# Patient Record
Sex: Female | Born: 1995 | Race: White | Hispanic: Yes | Marital: Married | Smoking: Never smoker
Health system: Southern US, Community
[De-identification: ages and names within clinical notes are randomized; demographics above are authoritative.]

## PROBLEM LIST (undated history)

## (undated) DIAGNOSIS — K2970 Gastritis, unspecified, without bleeding: Secondary | ICD-10-CM

## (undated) DIAGNOSIS — G8929 Other chronic pain: Secondary | ICD-10-CM

## (undated) DIAGNOSIS — K219 Gastro-esophageal reflux disease without esophagitis: Secondary | ICD-10-CM

## (undated) DIAGNOSIS — K900 Celiac disease: Secondary | ICD-10-CM

## (undated) DIAGNOSIS — R1013 Epigastric pain: Secondary | ICD-10-CM

## (undated) HISTORY — DX: Epigastric pain: R10.13

## (undated) HISTORY — DX: Gastro-esophageal reflux disease without esophagitis: K21.9

## (undated) HISTORY — DX: Other chronic pain: G89.29

## (undated) HISTORY — DX: Celiac disease: K90.0

---
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
## 2016-02-03 IMAGING — CR DG CHEST 2V
2 series · 2 of 2 positions shown · non-contrast
Comparison: 09/16/2007

CLINICAL DATA: Shortness of breath, chest pain

EXAM:
CHEST  2 VIEW

[view not recorded (1 of 2)]
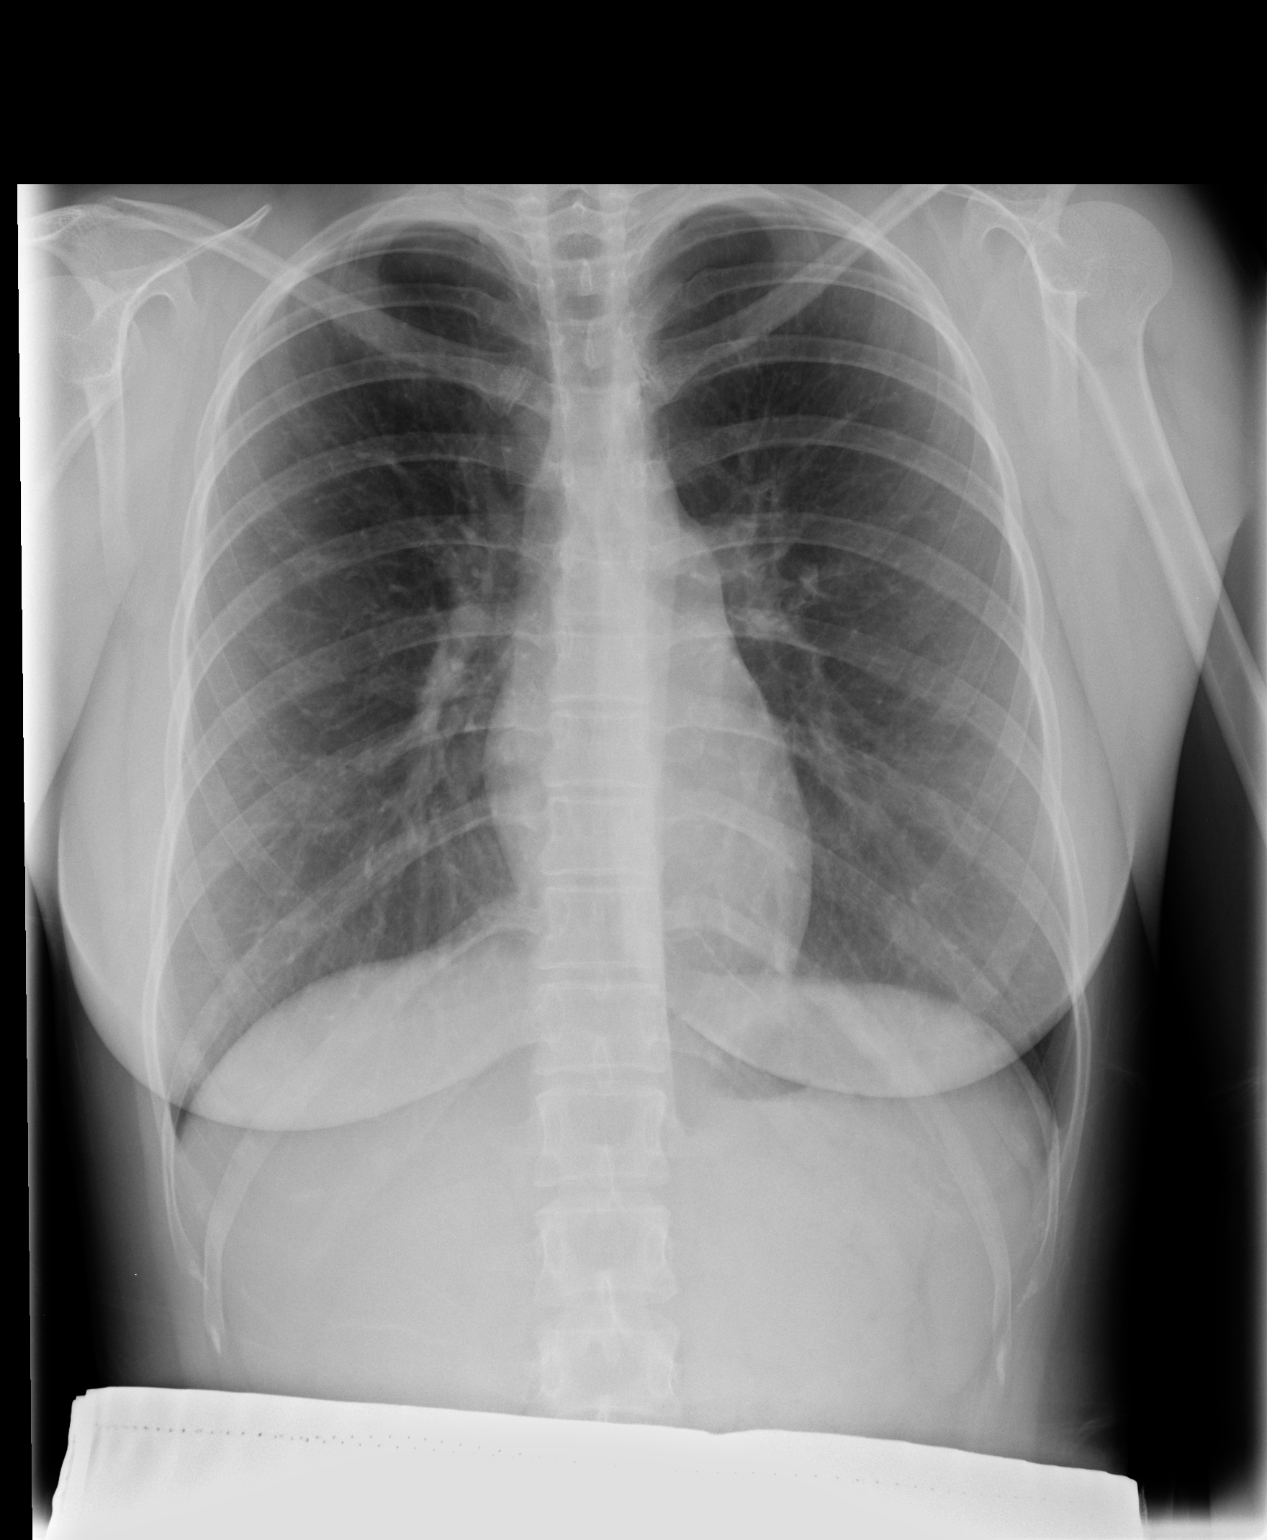

[view not recorded (2 of 2)]
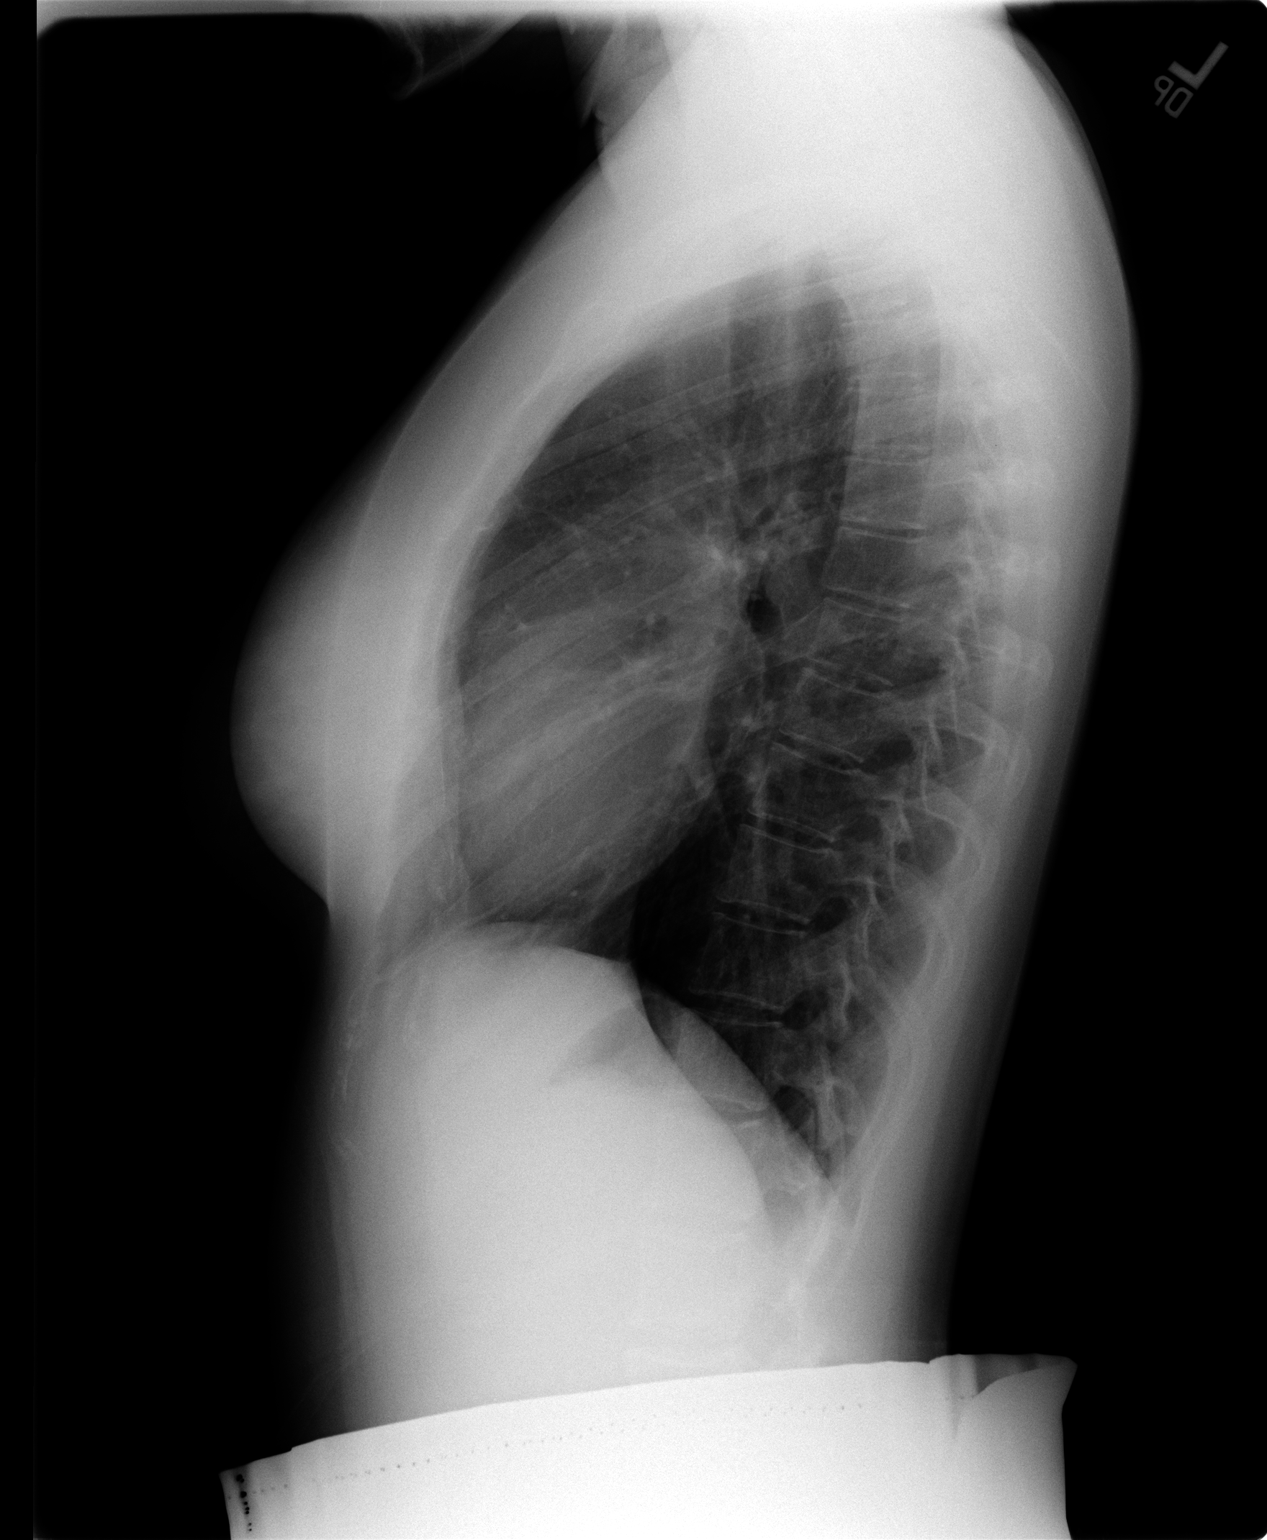

[2 of 2 positions shown; findings below may reference images not displayed]

FINDINGS: The heart size and mediastinal contours are within normal limits.
Both lungs are clear. The visualized skeletal structures are
unremarkable.
IMPRESSION: No active cardiopulmonary disease.

## 2016-02-14 ENCOUNTER — Ambulatory Visit
Admission: RE | Admit: 2016-02-14 | Discharge: 2016-02-14 | Disposition: A | Discharge status: Home / Self Care | Payer: Medicaid Other | Source: Ambulatory Visit | Attending: Pediatrics | Admitting: Pediatrics

## 2016-02-14 ENCOUNTER — Other Ambulatory Visit: Payer: Self-pay | Admitting: Pediatrics

## 2016-02-14 DIAGNOSIS — R079 Chest pain, unspecified: Secondary | ICD-10-CM

## 2016-02-14 DIAGNOSIS — R55 Syncope and collapse: Secondary | ICD-10-CM

## 2016-02-14 DIAGNOSIS — R42 Dizziness and giddiness: Secondary | ICD-10-CM

## 2016-02-14 DIAGNOSIS — R200 Anesthesia of skin: Secondary | ICD-10-CM

## 2016-02-27 ENCOUNTER — Ambulatory Visit
Admission: RE | Admit: 2016-02-27 | Discharge: 2016-02-27 | Disposition: A | Discharge status: Home / Self Care | Payer: Medicaid Other | Source: Ambulatory Visit | Attending: Pediatrics | Admitting: Pediatrics

## 2016-02-27 DIAGNOSIS — R200 Anesthesia of skin: Secondary | ICD-10-CM

## 2016-02-27 DIAGNOSIS — R55 Syncope and collapse: Secondary | ICD-10-CM

## 2016-02-27 DIAGNOSIS — R42 Dizziness and giddiness: Secondary | ICD-10-CM

## 2016-04-18 ENCOUNTER — Encounter (HOSPITAL_COMMUNITY): Payer: Self-pay

## 2016-04-18 ENCOUNTER — Emergency Department (HOSPITAL_COMMUNITY)
Admission: EM | Admit: 2016-04-18 | Discharge: 2016-04-18 | Disposition: A | Discharge status: Home / Self Care | Payer: Medicaid Other | Attending: Emergency Medicine | Admitting: Emergency Medicine

## 2016-04-18 DIAGNOSIS — R064 Hyperventilation: Secondary | ICD-10-CM | POA: Diagnosis not present

## 2016-04-18 DIAGNOSIS — K219 Gastro-esophageal reflux disease without esophagitis: Secondary | ICD-10-CM | POA: Diagnosis not present

## 2016-04-18 DIAGNOSIS — F419 Anxiety disorder, unspecified: Secondary | ICD-10-CM | POA: Diagnosis present

## 2016-04-18 DIAGNOSIS — F410 Panic disorder [episodic paroxysmal anxiety] without agoraphobia: Secondary | ICD-10-CM | POA: Diagnosis not present

## 2016-04-18 LAB — COMPREHENSIVE METABOLIC PANEL
ALT: 16 U/L (ref 14–54)
AST: 20 U/L (ref 15–41)
Albumin: 4.7 g/dL (ref 3.5–5.0)
Alkaline Phosphatase: 42 U/L (ref 38–126)
Anion gap: 12 (ref 5–15)
BILIRUBIN TOTAL: 0.6 mg/dL (ref 0.3–1.2)
BUN: 13 mg/dL (ref 6–20)
CALCIUM: 10.1 mg/dL (ref 8.9–10.3)
CO2: 22 mmol/L (ref 22–32)
CREATININE: 0.51 mg/dL (ref 0.44–1.00)
Chloride: 108 mmol/L (ref 101–111)
GFR calc Af Amer: >60 mL/min (ref >60)
Glucose, Bld: 98 mg/dL (ref 65–99)
Potassium: 3.5 mmol/L (ref 3.5–5.1)
Sodium: 142 mmol/L (ref 135–145)
TOTAL PROTEIN: 8.1 g/dL (ref 6.5–8.1)

## 2016-04-18 LAB — CBC
HCT: 38.2 % (ref 36.0–46.0)
Hemoglobin: 13.9 g/dL (ref 12.0–15.0)
MCH: 31.4 pg (ref 26.0–34.0)
MCHC: 36.4 g/dL — ABNORMAL HIGH (ref 30.0–36.0)
MCV: 86.4 fL (ref 78.0–100.0)
PLATELETS: 307 10*3/uL (ref 150–400)
RBC: 4.42 MIL/uL (ref 3.87–5.11)
RDW: 12 % (ref 11.5–15.5)
WBC: 7.5 10*3/uL (ref 4.0–10.5)

## 2016-04-18 LAB — I-STAT BETA HCG BLOOD, ED (MC, WL, AP ONLY): I-stat hCG, quantitative: <5 m[IU]/mL (ref <5)

## 2016-04-18 LAB — URINALYSIS, ROUTINE W REFLEX MICROSCOPIC
Bilirubin Urine: NEGATIVE
GLUCOSE, UA: NEGATIVE mg/dL
Hgb urine dipstick: NEGATIVE
KETONES UR: NEGATIVE mg/dL
NITRITE: NEGATIVE
PROTEIN: NEGATIVE mg/dL
Specific Gravity, Urine: 1.005 (ref 1.005–1.030)
pH: 7 (ref 5.0–8.0)

## 2016-04-18 LAB — URINE MICROSCOPIC-ADD ON: RBC / HPF: NONE SEEN RBC/hpf (ref 0–5)

## 2016-04-18 LAB — LIPASE, BLOOD: Lipase: 21 U/L (ref 11–51)

## 2016-04-18 MED ORDER — ONDANSETRON 4 MG PO TBDP
4.0000 mg | ORAL_TABLET | Freq: Once | ORAL | Status: AC | PRN
  Administered 2016-04-18: 4 mg via ORAL
  Filled 2016-04-18: qty 1

## 2016-04-18 MED ORDER — PROPRANOLOL HCL 20 MG PO TABS
20.0000 mg | ORAL_TABLET | Freq: Two times a day (BID) | ORAL | Status: DC | PRN
Start: 2016-04-18 — End: 2021-07-31

## 2016-04-18 NOTE — ED Notes (Addendum)
Per EMS, Pt, from school([employer]), c/o anxiety and dry heaving.  Denies pain.  EMS reports that this happens intermittently and typically lasts 2-3 hours.  Denies triggers.  Pt reports she was taking notes in class when anxiety began.  Denies SI/HI/AV.

## 2016-04-18 NOTE — Discharge Instructions (Signed)
Hyperventilation  Hyperventilation is breathing that is deeper and more rapid than normal. It is usually associated with panic and anxiety. Hyperventilation can make you feel breathless. It is sometimes called overbreathing. Breathing out too much causes a decrease in the amount of carbon dioxide gas in the blood. This leads to tingling and numbness in the hands, feet, and around the mouth. If this continues, your fingers, hands, and toes may begin to spasm. Hyperventilation usually lasts 20-30 minutes and can be associated with other symptoms of panic and anxiety, including:   · Chest pains or tightness.  · A pounding or irregular, racing heartbeat (palpitations).  · Dizziness.  · Lightheadedness.  · Dry mouth.  · Weakness.  · Confusion.  · Sleep disturbance.  CAUSES   Sudden onset (acute) hyperventilation is usually triggered by acute stress, anxiety, or emotional upset. Long-term (chronic) and recurring hyperventilation can occur with chronic lung problems, such emphysema or asthma. Other causes include:   · Nervousness.  · Stress.  · Stimulant, drug, or alcohol use.  · Lung disease.  · Infections, such as pneumonia.  · Heart problems.  · Severe pain.  · Waking from a bad dream.  · Pregnancy.  · Bleeding.  HOME CARE INSTRUCTIONS  · Learn and use breathing exercises that help you breathe from your diaphragm and abdomen.  · Practice relaxation techniques to reduce stress, such as visualization, meditation, and muscle release.  · During an attack, try breathing into a paper bag. This changes the carbon dioxide level and slows down breathing.  SEEK IMMEDIATE MEDICAL CARE IF:  · Your hyperventilation continues or gets worse.  MAKE SURE YOU:  · Understand these instructions.  · Will watch your condition.  · Will get help right away if you are not doing well or get worse.     This information is not intended to replace advice given to you by your health care provider. Make sure you discuss any questions you have with  your health care provider.     Document Released: 12/12/2000 Document Revised: 06/15/2012 Document Reviewed: 03/25/2012  Elsevier Interactive Patient Education ©2016 Elsevier Inc.    Panic Attacks  Panic attacks are sudden, short-lived surges of severe anxiety, fear, or discomfort. They may occur for no reason when you are relaxed, when you are anxious, or when you are sleeping. Panic attacks may occur for a number of reasons:   · Healthy people occasionally have panic attacks in extreme, life-threatening situations, such as war or natural disasters. Normal anxiety is a protective mechanism of the body that helps us react to danger (fight or flight response).  · Panic attacks are often seen with anxiety disorders, such as panic disorder, social anxiety disorder, generalized anxiety disorder, and phobias. Anxiety disorders cause excessive or uncontrollable anxiety. They may interfere with your relationships or other life activities.  · Panic attacks are sometimes seen with other mental illnesses, such as depression and posttraumatic stress disorder.  · Certain medical conditions, prescription medicines, and drugs of abuse can cause panic attacks.  SYMPTOMS   Panic attacks start suddenly, peak within 20 minutes, and are accompanied by four or more of the following symptoms:  · Pounding heart or fast heart rate (palpitations).  · Sweating.  · Trembling or shaking.  · Shortness of breath or feeling smothered.  · Feeling choked.  · Chest pain or discomfort.  · Nausea or strange feeling in your stomach.  · Dizziness, light-headedness, or feeling like you will faint.  · Chills   are having a heart attack. Although panic attacks can  be very scary, they are not life threatening. DIAGNOSIS  Panic attacks are diagnosed through an assessment by your health care provider. Your health care provider will ask questions about your symptoms, such as where and when they occurred. Your health care provider will also ask about your medical history and use of alcohol and drugs, including prescription medicines. Your health care provider may order blood tests or other studies to rule out a serious medical condition. Your health care provider may refer you to a mental health professional for further evaluation. TREATMENT   Most healthy people who have one or two panic attacks in an extreme, life-threatening situation will not require treatment.  The treatment for panic attacks associated with anxiety disorders or other mental illness typically involves counseling with a mental health professional, medicine, or a combination of both. Your health care provider will help determine what treatment is best for you.  Panic attacks due to physical illness usually go away with treatment of the illness. If prescription medicine is causing panic attacks, talk with your health care provider about stopping the medicine, decreasing the dose, or substituting another medicine.  Panic attacks due to alcohol or drug abuse go away with abstinence. Some adults need professional help in order to stop drinking or using drugs. HOME CARE INSTRUCTIONS   Take all medicines as directed by your health care provider.   Schedule and attend follow-up visits as directed by your health care provider. It is important to keep all your appointments. SEEK MEDICAL CARE IF:  You are not able to take your medicines as prescribed.  Your symptoms do not improve or get worse. SEEK IMMEDIATE MEDICAL CARE IF:   You experience panic attack symptoms that are different than your usual symptoms.  You have serious thoughts about hurting yourself or others.  You are taking medicine  for panic attacks and have a serious side effect. MAKE SURE YOU:  Understand these instructions.  Will watch your condition.  Will get help right away if you are not doing well or get worse.   This information is not intended to replace advice given to you by your health care provider. Make sure you discuss any questions you have with your health care provider.   Document Released: 12/15/2005 Document Revised: 12/20/2013 Document Reviewed: 07/29/2013 Elsevier Interactive Patient Education [employer]2016 Else[employer] Inc.

## 2016-04-18 NOTE — ED Provider Notes (Signed)
CSN: [id number]     Arrival date & time 04/18/16  1554 History   First MD Initiated Contact with Patient 04/18/16 1659     Chief Complaint  Patient presents with  . Dry Heaves   . Anxiety     (Consider location/radiation/quality/duration/timing/severity/associated sxs/prior Treatment) Patient is a 20 y.o. female presenting with anxiety. The history is provided by the patient.  Anxiety This is a recurrent problem. Episode onset: 1 year ago. Episode frequency: monthly, and have increased in severity. The problem has been gradually worsening (felt "heart pounding hard" and felt like she couldn't breathe, expressed to friend she was about to "have an attack"). Pertinent negatives include no abdominal pain. Nothing aggravates the symptoms. Nothing relieves the symptoms. She has tried nothing for the symptoms.    Past Medical History  Diagnosis Date  . Abdominal pain, chronic, epigastric   . Gastroesophageal reflux   . Gastritis    History reviewed. No pertinent past surgical history. Family History  Problem Relation Age of Onset  . Cholelithiasis Mother   . GER disease Father   . Cholelithiasis Father    Social History  Substance Use Topics  . Smoking status: Never Smoker   . Smokeless tobacco: Never Used  . Alcohol Use: No   OB History    No data available     Review of Systems  Gastrointestinal: Negative for abdominal pain.  All other systems reviewed and are negative.     Allergies  Review of patient's allergies indicates no known allergies.  Home Medications   Prior to Admission medications   Medication Sig Start Date End Date Taking? Authorizing Provider  omeprazole (PRILOSEC) 40 MG capsule Take 40 mg by mouth daily as needed (gas and indigestion).    Yes Historical Provider, MD  esomeprazole (NEXIUM) 40 MG capsule Take 1 capsule (40 mg total) by mouth daily before breakfast. 09/26/13 09/26/14  Jon Gills, MD  famotidine (PEPCID) 20 MG tablet Take 1 tablet (20  mg total) by mouth 2 (two) times daily. Patient not taking: Reported on 09/21/2015 10/13/14   Francee Piccolo, PA-C  LORazepam (ATIVAN) 1 MG tablet Take 1 tablet (1 mg total) by mouth every 4 (four) hours as needed (nausea, retching attacks). Patient not taking: Reported on 04/18/2016 09/21/15   Arthor Captain, PA-C  promethazine (PHENERGAN) 25 MG suppository Place 1 suppository (25 mg total) rectally every 6 (six) hours as needed for nausea or vomiting. Patient not taking: Reported on 04/18/2016 09/21/15   Arthor Captain, PA-C  sucralfate (CARAFATE) 1 GM/10ML suspension Take 10 mLs (1 g total) by mouth 4 (four) times daily -  with meals and at bedtime. Patient not taking: Reported on 04/18/2016 09/21/15   Antony Madura, PA-C   BP 109/77 mmHg  Pulse 93  Temp(Src) 98.6 F (37 C) (Oral)  Resp 18  SpO2 99%  LMP 04/03/2016 Physical Exam  Constitutional: She is oriented to person, place, and time. She appears well-developed and well-nourished. No distress.  HENT:  Head: Normocephalic.  Eyes: Conjunctivae are normal.  Neck: Neck supple. No tracheal deviation present.  Cardiovascular: Normal rate, regular rhythm and normal heart sounds.   Pulmonary/Chest: Effort normal and breath sounds normal. No respiratory distress. She has no wheezes. She has no rales.  Abdominal: Soft. She exhibits no distension. There is no tenderness.  Neurological: She is alert and oriented to person, place, and time.  Skin: Skin is warm and dry.  Psychiatric: She has a normal mood and affect.  Vitals  reviewed.   ED Course  Procedures (including critical care time) Labs Review Labs Reviewed  CBC - Abnormal; Notable for the following:    MCHC 36.4 (*)    All other components within normal limits  URINALYSIS, ROUTINE W REFLEX MICROSCOPIC (NOT AT [facility]) - Abnormal; Notable for the following:    Leukocytes, UA TRACE (*)    All other components within normal limits  URINE MICROSCOPIC-ADD ON - Abnormal; Notable for the  following:    Squamous Epithelial / LPF 0-5 (*)    Bacteria, UA RARE (*)    All other components within normal limits  LIPASE, BLOOD  COMPREHENSIVE METABOLIC PANEL  I-STAT BETA HCG BLOOD, ED ([facility], [facility], [facility] ONLY)    Imaging Review No results found. I have personally reviewed and evaluated these images and lab results as part of my medical decision-making.   EKG Interpretation None      MDM   Final diagnoses:  Panic attacks  Hyperventilation   20 y.o. female presents with Recurrent panic symptoms she has been having more frequently since graduating high school last year. 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 Today she was taking notes in class and felt anxiety, told a friend to her that she felt like she was having "an attack", she stepped outside of class and began to hyperventilate, felt like she was going to pass out and her fingers became tingly. She started vomiting and came to the emergency department. She has resolved completely since the onset of her symptoms. This appears to be related to emotional distress. She has had a previous workup including with cardiology and neurology regarding seizure-like activity that she has had previously. I discussed management of hyperventilation with her by breathing into a paper bag and she was provided low-dose propranolol to help with abortive therapy until able to follow up with her primary care provider or discuss this with a therapist. She may benefit from SSRI or other more prolonged therapy that can be followed as an outpatient.    Lyndal Pulleyaniel Jahaad Penado, MD 04/18/16 [phone]

## 2016-10-28 IMAGING — DX DG ABDOMEN ACUTE W/ 1V CHEST
3 series · 3 of 3 positions shown · non-contrast
Comparison: Chest dated 12/27/2014 and abdomen dated 01/18/2012.

CLINICAL DATA: Abdominal pain, nausea and vomiting since last
night.

EXAM:
DG ABDOMEN ACUTE W/ 1V CHEST

[chest pa]
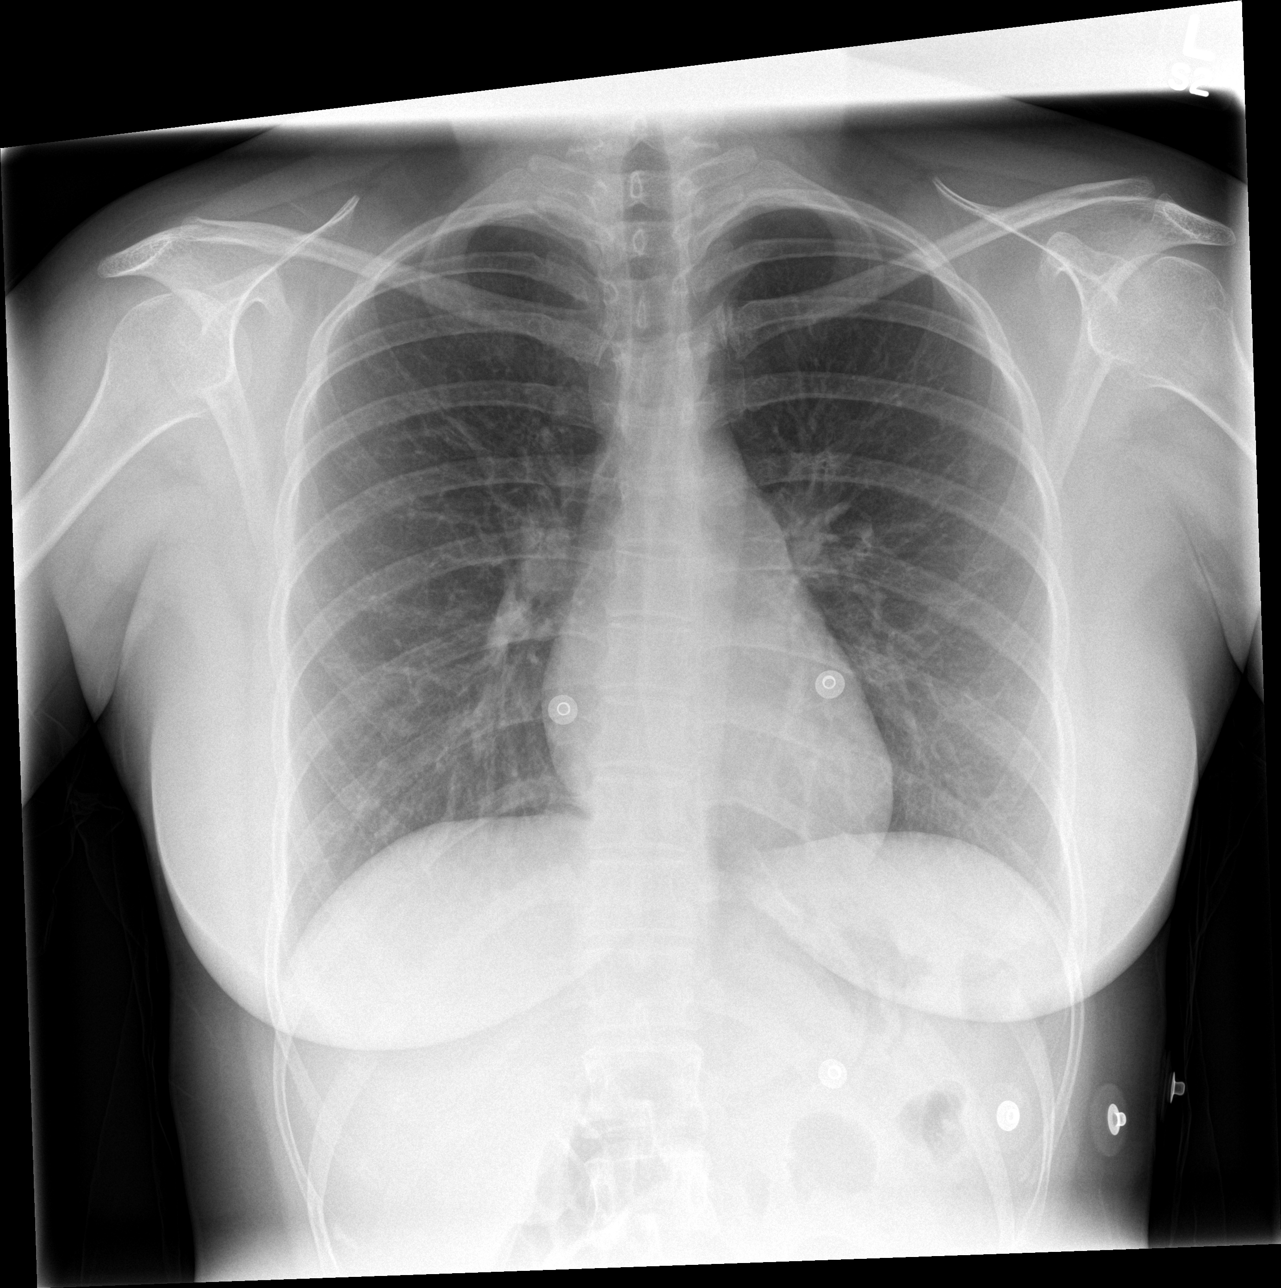

[abdomen erect]
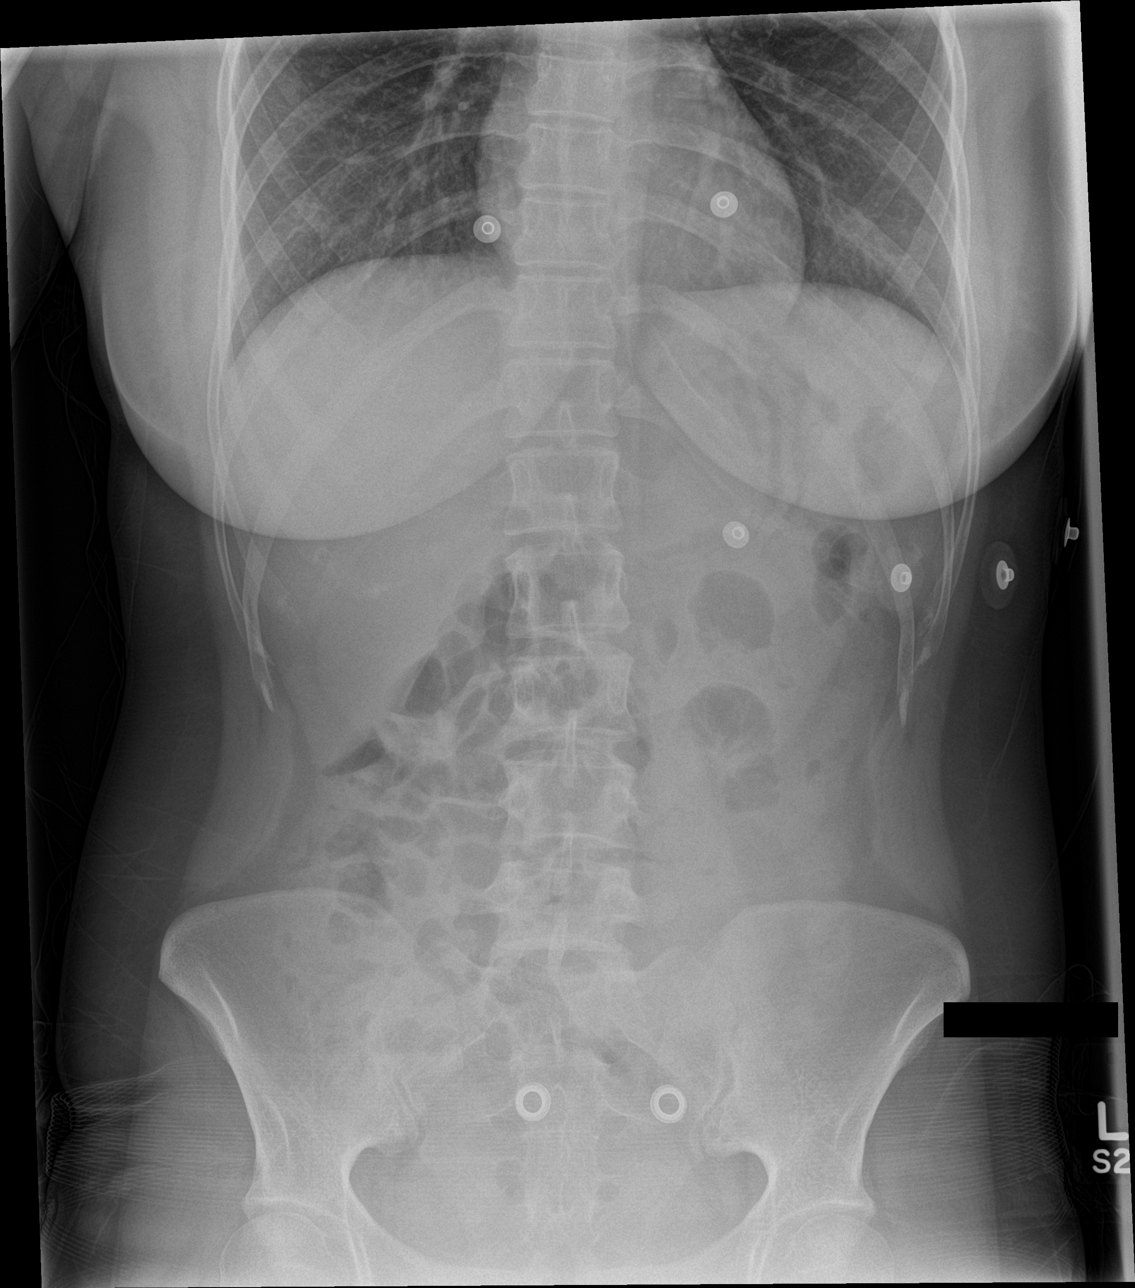

[abdomen supine]
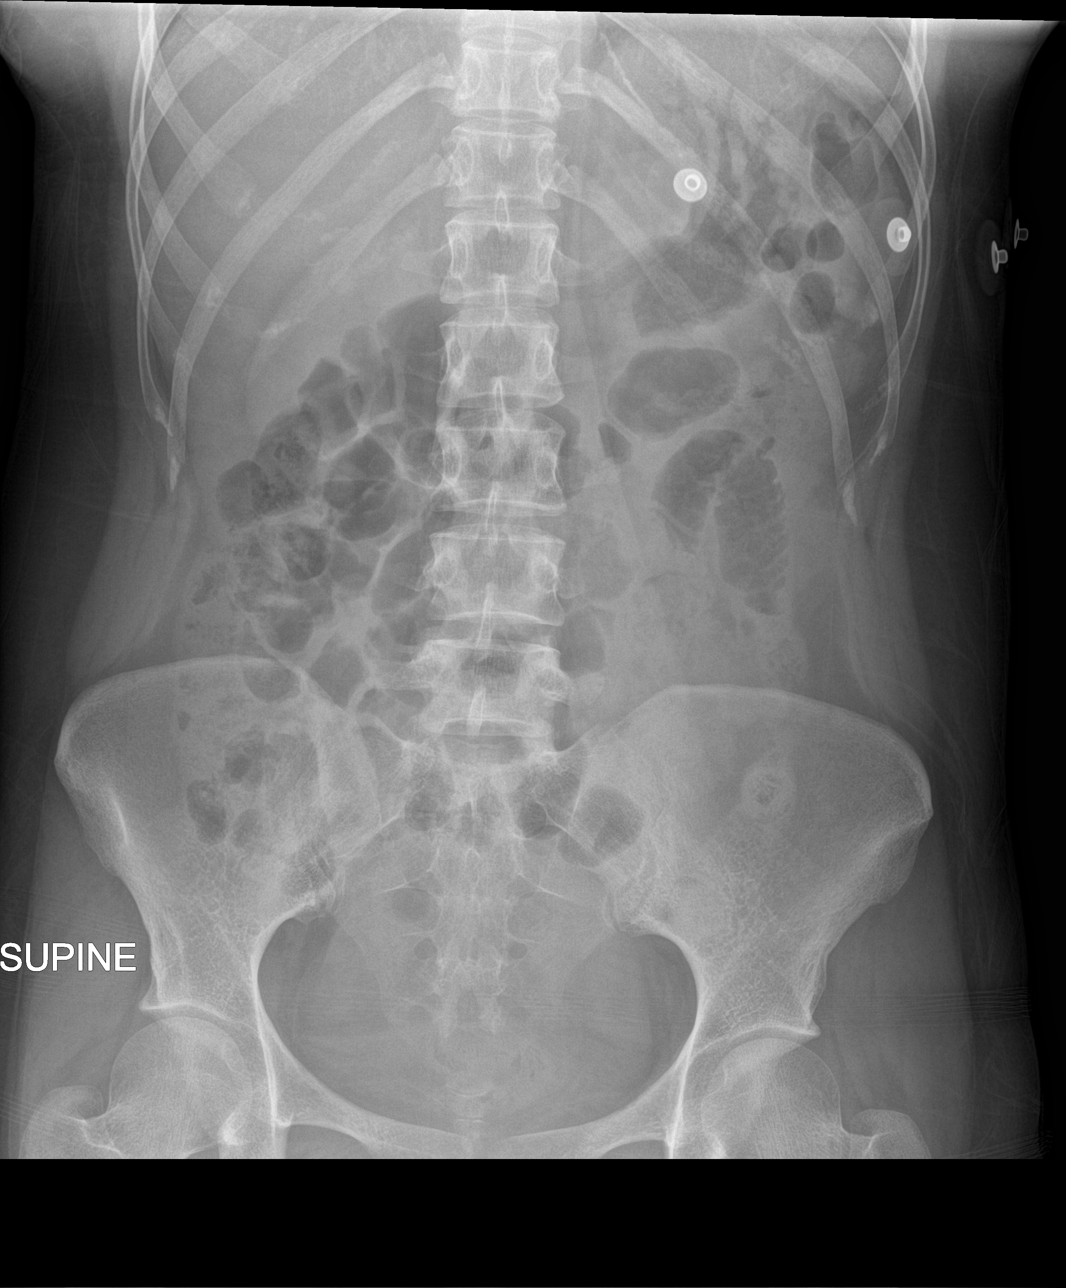

[3 of 3 positions shown; findings below may reference images not displayed]

FINDINGS: Normal sized heart. Clear lungs. Normal bowel gas pattern without
free peritoneal air. Normal appearing bones.
IMPRESSION: Normal examination.

## 2016-12-24 ENCOUNTER — Encounter: Payer: Self-pay | Admitting: Certified Nurse Midwife

## 2016-12-24 ENCOUNTER — Ambulatory Visit (INDEPENDENT_AMBULATORY_CARE_PROVIDER_SITE_OTHER): Payer: Medicaid Other | Admitting: Certified Nurse Midwife

## 2016-12-24 VITALS — BP 132/93 | HR 103 | Resp 16 | Ht 59.0 in | Wt 106.0 lb

## 2016-12-24 DIAGNOSIS — Z0000 Encounter for general adult medical examination without abnormal findings: Secondary | ICD-10-CM | POA: Diagnosis not present

## 2016-12-24 DIAGNOSIS — N926 Irregular menstruation, unspecified: Secondary | ICD-10-CM

## 2016-12-24 DIAGNOSIS — Z01419 Encounter for gynecological examination (general) (routine) without abnormal findings: Secondary | ICD-10-CM | POA: Diagnosis not present

## 2016-12-24 DIAGNOSIS — N915 Oligomenorrhea, unspecified: Secondary | ICD-10-CM

## 2016-12-24 DIAGNOSIS — Z113 Encounter for screening for infections with a predominantly sexual mode of transmission: Secondary | ICD-10-CM

## 2016-12-24 LAB — POCT URINE PREGNANCY
PREG TEST UR: NEGATIVE
Preg Test, Ur: NEGATIVE

## 2016-12-24 NOTE — Progress Notes (Signed)
Subjective:        Leah Ford is a 20 y.o. female here for a routine exam.  Current complaints: irregular periods since starting having them Dec. 25th, 2010.  Is sexually active with one female partner.  Desires full STD screening exam.  Has never had a work up for her irregular periods. Reports normal flow when she is on her period, lasting about 5 days.  Skips around 3 months at time with periods.  Does have acne.  Denies any excess hair growth.  Denies any family hx of PCOS.  Is here for exam with her best friend.  Is on Christmas break from college, attends in [place].     Uses condoms for birth control.  Has never been pregnant, does not desire pregnancy at this time.  Has used plan B twice for birth control, with last reported use 1 week ago.  Birth control options discussed.  She desires to try OCPs after her work up is completed.  Desires to have monthly cycles.      Personal health questionnaire:  Is patient Ashkenazi Jewish, have a family history of breast and/or ovarian cancer: no Is there a family history of uterine cancer diagnosed at age < 26, gastrointestinal cancer, urinary tract cancer, family member who is a Field seismologist syndrome-associated carrier: no Is the patient overweight and hypertensive, family history of diabetes, personal history of gestational diabetes, preeclampsia or PCOS: yes Is patient over 59, have PCOS,  family history of premature CHD under age 64, diabetes, smoke, have hypertension or peripheral artery disease:  no At any time, has a partner hit, kicked or otherwise hurt or frightened you?: no Over the past 2 weeks, have you felt down, depressed or hopeless?: no Over the past 2 weeks, have you felt little interest or pleasure in doing things?:no   Gynecologic History Patient's last menstrual period was 11/27/2016 (exact date). Contraception: condoms Last Pap: n/a.  Last mammogram: n/a.   Obstetric History OB History  Gravida Para Term Preterm AB Living   0 0 0 0 0 0  SAB TAB Ectopic Multiple Live Births  0 0 0 0 0        Past Medical History:  Diagnosis Date  . Abdominal pain, chronic, epigastric   . Gastritis   . Gastroesophageal reflux     History reviewed. No pertinent surgical history.   Current Outpatient Prescriptions:  .  cetirizine (ZYRTEC) 10 MG tablet, Take 10 mg by mouth daily., Disp: , Rfl:  .  omeprazole (PRILOSEC) 40 MG capsule, Take 40 mg by mouth daily as needed (gas and indigestion). , Disp: , Rfl:  .  propranolol (INDERAL) 20 MG tablet, Take 1 tablet (20 mg total) by mouth 2 (two) times daily as needed (panic)., Disp: 10 tablet, Rfl: 0 .  esomeprazole (NEXIUM) 40 MG capsule, Take 1 capsule (40 mg total) by mouth daily before breakfast., Disp: 30 capsule, Rfl: 5 .  famotidine (PEPCID) 20 MG tablet, Take 1 tablet (20 mg total) by mouth 2 (two) times daily. (Patient not taking: Reported on 12/24/2016), Disp: 30 tablet, Rfl: 0 .  LORazepam (ATIVAN) 1 MG tablet, Take 1 tablet (1 mg total) by mouth every 4 (four) hours as needed (nausea, retching attacks). (Patient not taking: Reported on 12/24/2016), Disp: 20 tablet, Rfl: 0 .  promethazine (PHENERGAN) 25 MG suppository, Place 1 suppository (25 mg total) rectally every 6 (six) hours as needed for nausea or vomiting. (Patient not taking: Reported on 12/24/2016), Disp: 20 suppository,  Rfl: 0 .  sucralfate (CARAFATE) 1 GM/10ML suspension, Take 10 mLs (1 g total) by mouth 4 (four) times daily -  with meals and at bedtime. (Patient not taking: Reported on 12/24/2016), Disp: 420 mL, Rfl: 0 No Known Allergies  Social History  Substance Use Topics  . Smoking status: Never Smoker  . Smokeless tobacco: Never Used  . Alcohol use No    Family History  Problem Relation Age of Onset  . Diabetes Sister   . Diabetes Sister   . Cholelithiasis Mother   . Diabetes Mother   . Hypertension Mother   . Hyperlipidemia Mother   . GER disease Father   . Cholelithiasis Father   .  Hyperlipidemia Father   . Cancer Paternal Grandmother     unsure which cancer per pt      Review of Systems  Constitutional: negative for fatigue and weight loss Respiratory: negative for cough and wheezing Cardiovascular: negative for chest pain, fatigue and palpitations Gastrointestinal: negative for abdominal pain and change in bowel habits Musculoskeletal:negative for myalgias Neurological: negative for gait problems and tremors Behavioral/Psych: negative for abusive relationship, depression Endocrine: negative for temperature intolerance    Genitourinary:negative for abnormal menstrual periods, genital lesions, hot flashes, sexual problems and vaginal discharge, + irregular periods Integument/breast: negative for breast lump, breast tenderness, nipple discharge and skin lesion(s)    Objective:       BP (!) 132/93   Pulse (!) 103   Resp 16   Ht 4' 11"  (1.499 m)   Wt 106 lb (48.1 kg)   LMP 11/27/2016 (Exact Date)   BMI 21.41 kg/m  General:   alert  Skin:   no rash or abnormalities  Lungs:   clear to auscultation bilaterally  Heart:   regular rate and rhythm, S1, S2 normal, no murmur, click, rub or gallop  Breasts:   normal without suspicious masses, skin or nipple changes or axillary nodes  Abdomen:  normal findings: no organomegaly, soft, non-tender and no hernia  Pelvis:  External genitalia: normal general appearance Urinary system: urethral meatus normal and bladder without fullness, nontender Vaginal: normal without tenderness, induration or masses Cervix: no CMT Adnexa: normal bimanual exam Uterus: anteverted and non-tender, normal size   Lab Review Urine pregnancy test Labs reviewed yes Radiologic studies reviewed no  50% of 30 min visit spent on counseling and coordination of care.    Assessment:    Healthy female exam.   H/O irregular periods  H/O Acne  High risk sexual behaviors  STD screening exam  Plan:    Education reviewed: calcium  supplements, depression evaluation, low fat, low cholesterol diet, safe sex/STD prevention, self breast exams, skin cancer screening and weight bearing exercise. Follow up in: 2 weeks after labs come back and [place] results.   Meds ordered this encounter  Medications  . cetirizine (ZYRTEC) 10 MG tablet    Sig: Take 10 mg by mouth daily.   Orders Placed This Encounter  Procedures  . US Pelvis Complete    Standing Status:   Future    Standing Expiration Date:   02/24/2018    Order Specific Question:   Reason for Exam (SYMPTOM  OR DIAGNOSIS REQUIRED)    Answer:   irregular periods, acne    Order Specific Question:   Preferred imaging location?    Answer:   Internal  . US Transvaginal Non-OB    Standing Status:   Future    Standing Expiration Date:   02/24/2018  Order Specific Question:   Reason for Exam (SYMPTOM  OR DIAGNOSIS REQUIRED)    Answer:   irregular periods, r/0 PCOS    Order Specific Question:   Preferred imaging location?    Answer:   Internal  . Prolactin  . Testosterone, Free, Total, SHBG  . 17-Hydroxyprogesterone  . Progesterone  . CBC with Differential/Platelet  . Comprehensive metabolic panel  . TSH  . Hemoglobin A1c  . RPR  . Hepatitis C antibody  . Hepatitis B surface antigen  . HIV antibody  . NuSwab Vaginitis Plus (VG+)  . POCT urine pregnancy  . POCT urine pregnancy   Need to obtain previous records Possible management options include: LARK

## 2016-12-24 NOTE — Progress Notes (Signed)
Pt c/o sporadic periods. No period in May, June, July, Oct, Dec of 2017. She feels cramping but has no period. The period in August was light and lasted for 3 days. The period in Nov was normal flow and lasted for her normal 7 days. She does not desire BC at this time d/t side effects.

## 2016-12-27 LAB — NUSWAB VAGINITIS PLUS (VG+)
ATOPOBIUM VAGINAE: HIGH {score} — AB
CANDIDA ALBICANS, NAA: NEGATIVE
CHLAMYDIA TRACHOMATIS, NAA: NEGATIVE
Candida glabrata, NAA: NEGATIVE
NEISSERIA GONORRHOEAE, NAA: NEGATIVE
Trich vag by NAA: NEGATIVE

## 2016-12-30 LAB — CBC WITH DIFFERENTIAL/PLATELET
BASOS ABS: 0 10*3/uL (ref 0.0–0.2)
Basos: 0 %
EOS (ABSOLUTE): 0.1 10*3/uL (ref 0.0–0.4)
Eos: 1 %
HEMOGLOBIN: 15 g/dL (ref 11.1–15.9)
Hematocrit: 43.2 % (ref 34.0–46.6)
Immature Grans (Abs): 0 10*3/uL (ref 0.0–0.1)
Immature Granulocytes: 0 %
LYMPHS ABS: 2 10*3/uL (ref 0.7–3.1)
LYMPHS: 27 %
MCH: 31.6 pg (ref 26.6–33.0)
MCHC: 34.7 g/dL (ref 31.5–35.7)
MCV: 91 fL (ref 79–97)
MONOCYTES: 7 %
Monocytes Absolute: 0.5 10*3/uL (ref 0.1–0.9)
Neutrophils Absolute: 4.8 10*3/uL (ref 1.4–7.0)
Neutrophils: 65 %
Platelets: 309 10*3/uL (ref 150–379)
RBC: 4.74 x10E6/uL (ref 3.77–5.28)
RDW: 13.4 % (ref 12.3–15.4)
WBC: 7.4 10*3/uL (ref 3.4–10.8)

## 2016-12-30 LAB — COMPREHENSIVE METABOLIC PANEL
ALBUMIN: 4.6 g/dL (ref 3.5–5.5)
ALK PHOS: 45 IU/L (ref 39–117)
ALT: 11 IU/L (ref 0–32)
AST: 13 IU/L (ref 0–40)
Albumin/Globulin Ratio: 1.6 (ref 1.2–2.2)
BUN/Creatinine Ratio: 24 — ABNORMAL HIGH (ref 9–23)
BUN: 10 mg/dL (ref 6–20)
Bilirubin Total: 0.4 mg/dL (ref 0.0–1.2)
CO2: 21 mmol/L (ref 18–29)
CREATININE: 0.42 mg/dL — AB (ref 0.57–1.00)
Calcium: 9.5 mg/dL (ref 8.7–10.2)
Chloride: 102 mmol/L (ref 96–106)
GFR calc Af Amer: 172 mL/min/{1.73_m2} (ref >59)
GFR calc non Af Amer: 149 mL/min/{1.73_m2} (ref >59)
GLUCOSE: 76 mg/dL (ref 65–99)
Globulin, Total: 2.9 g/dL (ref 1.5–4.5)
Potassium: 4.3 mmol/L (ref 3.5–5.2)
Sodium: 141 mmol/L (ref 134–144)
Total Protein: 7.5 g/dL (ref 6.0–8.5)

## 2016-12-30 LAB — HEMOGLOBIN A1C
ESTIMATED AVERAGE GLUCOSE: 94 mg/dL
HEMOGLOBIN A1C: 4.9 % (ref 4.8–5.6)

## 2016-12-30 LAB — HEPATITIS C ANTIBODY

## 2016-12-30 LAB — TESTOSTERONE, FREE, TOTAL, SHBG
Sex Hormone Binding: 14.4 nmol/L — ABNORMAL LOW (ref 24.6–122.0)
TESTOSTERONE FREE: 3.4 pg/mL
Testosterone: 33 ng/dL

## 2016-12-30 LAB — PROGESTERONE: PROGESTERONE: 0.4 ng/mL

## 2016-12-30 LAB — HIV ANTIBODY (ROUTINE TESTING W REFLEX): HIV Screen 4th Generation wRfx: NONREACTIVE

## 2016-12-30 LAB — HEPATITIS B SURFACE ANTIGEN: Hepatitis B Surface Ag: NEGATIVE

## 2016-12-30 LAB — 17-HYDROXYPROGESTERONE: 17-Hydroxyprogesterone: 52 ng/dL

## 2016-12-30 LAB — TSH: TSH: 0.841 u[IU]/mL (ref 0.450–4.500)

## 2016-12-30 LAB — RPR: RPR: NONREACTIVE

## 2016-12-30 LAB — PROLACTIN: Prolactin: 22.8 ng/mL (ref 4.8–23.3)

## 2017-01-08 ENCOUNTER — Ambulatory Visit (INDEPENDENT_AMBULATORY_CARE_PROVIDER_SITE_OTHER): Payer: Medicaid Other

## 2017-01-08 DIAGNOSIS — N926 Irregular menstruation, unspecified: Secondary | ICD-10-CM | POA: Diagnosis not present

## 2017-01-14 ENCOUNTER — Ambulatory Visit: Payer: Medicaid Other | Admitting: Obstetrics and Gynecology

## 2017-05-27 ENCOUNTER — Other Ambulatory Visit: Payer: Self-pay | Admitting: Physician Assistant

## 2017-05-27 DIAGNOSIS — R1084 Generalized abdominal pain: Secondary | ICD-10-CM

## 2017-06-05 ENCOUNTER — Ambulatory Visit
Admission: RE | Admit: 2017-06-05 | Discharge: 2017-06-05 | Disposition: A | Discharge status: Home / Self Care | Payer: Medicaid Other | Source: Ambulatory Visit | Attending: Physician Assistant | Admitting: Physician Assistant

## 2017-06-05 DIAGNOSIS — R1084 Generalized abdominal pain: Secondary | ICD-10-CM

## 2017-09-07 ENCOUNTER — Emergency Department (HOSPITAL_COMMUNITY)
Admission: EM | Admit: 2017-09-07 | Discharge: 2017-09-07 | Disposition: A | Discharge status: Home / Self Care | Payer: Medicaid Other | Attending: Emergency Medicine | Admitting: Emergency Medicine

## 2017-09-07 ENCOUNTER — Encounter (HOSPITAL_COMMUNITY): Payer: Self-pay | Admitting: Emergency Medicine

## 2017-09-07 DIAGNOSIS — R1013 Epigastric pain: Secondary | ICD-10-CM | POA: Diagnosis not present

## 2017-09-07 DIAGNOSIS — R112 Nausea with vomiting, unspecified: Secondary | ICD-10-CM | POA: Insufficient documentation

## 2017-09-07 DIAGNOSIS — Z79899 Other long term (current) drug therapy: Secondary | ICD-10-CM | POA: Insufficient documentation

## 2017-09-07 DIAGNOSIS — R1010 Upper abdominal pain, unspecified: Secondary | ICD-10-CM

## 2017-09-07 LAB — URINALYSIS, ROUTINE W REFLEX MICROSCOPIC
BILIRUBIN URINE: NEGATIVE
GLUCOSE, UA: NEGATIVE mg/dL
HGB URINE DIPSTICK: NEGATIVE
KETONES UR: 5 mg/dL — AB
Leukocytes, UA: NEGATIVE
Nitrite: NEGATIVE
PROTEIN: NEGATIVE mg/dL
Specific Gravity, Urine: 1.032 — ABNORMAL HIGH (ref 1.005–1.030)
pH: 5 (ref 5.0–8.0)

## 2017-09-07 LAB — COMPREHENSIVE METABOLIC PANEL
ALK PHOS: 33 U/L — AB (ref 38–126)
ALT: 16 U/L (ref 14–54)
AST: 19 U/L (ref 15–41)
Albumin: 4.1 g/dL (ref 3.5–5.0)
Anion gap: 10 (ref 5–15)
BUN: 13 mg/dL (ref 6–20)
CALCIUM: 9 mg/dL (ref 8.9–10.3)
CHLORIDE: 107 mmol/L (ref 101–111)
CO2: 20 mmol/L — AB (ref 22–32)
CREATININE: 0.51 mg/dL (ref 0.44–1.00)
GFR calc non Af Amer: >60 mL/min (ref >60)
Glucose, Bld: 101 mg/dL — ABNORMAL HIGH (ref 65–99)
Potassium: 3.6 mmol/L (ref 3.5–5.1)
SODIUM: 137 mmol/L (ref 135–145)
Total Bilirubin: 0.8 mg/dL (ref 0.3–1.2)
Total Protein: 7.2 g/dL (ref 6.5–8.1)

## 2017-09-07 LAB — CBC
HCT: 36.8 % (ref 36.0–46.0)
Hemoglobin: 13.4 g/dL (ref 12.0–15.0)
MCH: 30.9 pg (ref 26.0–34.0)
MCHC: 36.4 g/dL — AB (ref 30.0–36.0)
MCV: 85 fL (ref 78.0–100.0)
PLATELETS: 219 10*3/uL (ref 150–400)
RBC: 4.33 MIL/uL (ref 3.87–5.11)
RDW: 11.7 % (ref 11.5–15.5)
WBC: 8.4 10*3/uL (ref 4.0–10.5)

## 2017-09-07 LAB — I-STAT BETA HCG BLOOD, ED (MC, WL, AP ONLY)

## 2017-09-07 LAB — LIPASE, BLOOD: LIPASE: 23 U/L (ref 11–51)

## 2017-09-07 MED ORDER — GI COCKTAIL ~~LOC~~
30.0000 mL | Freq: Once | ORAL | Status: AC
  Administered 2017-09-07: 30 mL via ORAL
  Filled 2017-09-07: qty 30

## 2017-09-07 MED ORDER — PANTOPRAZOLE SODIUM 20 MG PO TBEC: 20.0000 mg | DELAYED_RELEASE_TABLET | Freq: Every day | ORAL | 0 refills | Status: DC

## 2017-09-07 MED ORDER — ONDANSETRON HCL 4 MG/2ML IJ SOLN
4.0000 mg | Freq: Once | INTRAMUSCULAR | Status: AC
  Administered 2017-09-07: 4 mg via INTRAVENOUS
  Filled 2017-09-07: qty 2

## 2017-09-07 MED ORDER — MORPHINE SULFATE (PF) 4 MG/ML IV SOLN
4.0000 mg | Freq: Once | INTRAVENOUS | Status: AC
  Administered 2017-09-07: 4 mg via INTRAVENOUS
  Filled 2017-09-07: qty 1

## 2017-09-07 NOTE — Discharge Instructions (Signed)
You were seen today for abdominal pain. This could be related to reflux or gallbladder disease. Your workup however is reassuring. You will be started on an acid reducer. If you have any new or worsening symptoms you should be reevaluated.

## 2017-09-07 NOTE — ED Triage Notes (Signed)
Patient complaining of abdominal pain in her mid upper area that started on Sunday morning. Patient states that it is radiating to her back. Patient states she has taken Nexium. Patient states she vomited.

## 2017-09-07 NOTE — ED Notes (Signed)
Pt given Sprite for PO challenge, tolerated well.

## 2017-09-07 NOTE — ED Provider Notes (Signed)
[facility]-EMERGENCY DEPT Provider Note   CSN: [id number] Arrival date & time: 09/07/17  0134     History   Chief Complaint Chief Complaint  Patient presents with  . Abdominal Pain    HPI Leah Ford is a 21 y.o. female.  HPI  This is a 21 year old female who presents with upper abdominal pain. Patient reports onset of worsening abdominal pain that started Sunday morning. It is in the mid upper abdomen and radiates to her back. Currently pain is 8 out of 10. She does report nausea and vomiting. No diarrhea. No bloody emesis. Pain is sharp in nature. Some association with food. She denies fevers, urinary symptoms.  Past Medical History:  Diagnosis Date  . Abdominal pain, chronic, epigastric   . Gastritis   . Gastroesophageal reflux     Patient Active Problem List   Diagnosis Date Noted  . Screening examination for STD (sexually transmitted disease) 12/24/2016  . Irregular periods/menstrual cycles 12/24/2016  . H/O sleep apnea 10/26/2012  . Simple constipation 09/30/2012  . Belching 09/30/2012  . Epigastric abdominal pain   . Gastroesophageal reflux     History reviewed. No pertinent surgical history.  OB History    Gravida Para Term Preterm AB Living   0 0 0 0 0 0   SAB TAB Ectopic Multiple Live Births   0 0 0 0 0       Home Medications    Prior to Admission medications   Medication Sig Start Date End Date Taking? Authorizing Provider  cetirizine (ZYRTEC) 10 MG tablet Take 10 mg by mouth daily.    [provider]  esomeprazole (NEXIUM) 40 MG capsule Take 1 capsule (40 mg total) by mouth daily before breakfast. 09/26/13 09/26/14  Jon Gills, MD  famotidine (PEPCID) 20 MG tablet Take 1 tablet (20 mg total) by mouth 2 (two) times daily. Patient not taking: Reported on 12/24/2016 10/13/14   Piepenbrink, Victorino Dike, PA-C  LORazepam (ATIVAN) 1 MG tablet Take 1 tablet (1 mg total) by mouth every 4 (four) hours as needed (nausea, retching attacks). Patient  not taking: Reported on 12/24/2016 09/21/15   Arthor Captain, PA-C  omeprazole (PRILOSEC) 40 MG capsule Take 40 mg by mouth daily as needed (gas and indigestion).     [provider]  pantoprazole (PROTONIX) 20 MG tablet Take 1 tablet (20 mg total) by mouth daily. 09/07/17   Kensie Susman, Mayer Masker, MD  promethazine (PHENERGAN) 25 MG suppository Place 1 suppository (25 mg total) rectally every 6 (six) hours as needed for nausea or vomiting. Patient not taking: Reported on 12/24/2016 09/21/15   Arthor Captain, PA-C  propranolol (INDERAL) 20 MG tablet Take 1 tablet (20 mg total) by mouth 2 (two) times daily as needed (panic). 04/18/16   Lyndal Pulley, MD  sucralfate (CARAFATE) 1 GM/10ML suspension Take 10 mLs (1 g total) by mouth 4 (four) times daily -  with meals and at bedtime. Patient not taking: Reported on 12/24/2016 09/21/15   Antony Madura, PA-C    Family History Family History  Problem Relation Age of Onset  . Diabetes Sister   . Diabetes Sister   . Cholelithiasis Mother   . Diabetes Mother   . Hypertension Mother   . Hyperlipidemia Mother   . GER disease Father   . Cholelithiasis Father   . Hyperlipidemia Father   . Cancer Paternal Grandmother        unsure which cancer per pt    Social History Social History  Substance Use  Topics  . Smoking status: Never Smoker  . Smokeless tobacco: Never Used  . Alcohol use No     Allergies   Patient has no known allergies.   Review of Systems Review of Systems  Constitutional: Negative for fever.  Respiratory: Negative for shortness of breath.   Cardiovascular: Negative for chest pain.  Gastrointestinal: Positive for abdominal pain, nausea and vomiting. Negative for diarrhea.  Genitourinary: Negative for dysuria.  All other systems reviewed and are negative.    Physical Exam Updated Vital Signs BP 109/74   Pulse 92   Temp 98.4 F (36.9 C) (Oral)   Resp 16   Ht  (1.499 m)   Wt 47.2 kg (104 lb)   LMP 08/29/2017    SpO2 100%   BMI 21.01 kg/m   Physical Exam  Constitutional: She is oriented to person, place, and time. She appears well-developed and well-nourished. No distress.  HENT:  Head: Normocephalic and atraumatic.  Cardiovascular: Normal rate, regular rhythm and normal heart sounds.   Pulmonary/Chest: Effort normal and breath sounds normal. No respiratory distress. She has no wheezes.  Abdominal: Soft. Bowel sounds are normal. She exhibits no mass. There is tenderness. There is no guarding.  Epigastric tenderness palpation without rebound or guarding  Neurological: She is alert and oriented to person, place, and time.  Skin: Skin is warm and dry.  Psychiatric: She has a normal mood and affect.  Nursing note and vitals reviewed.    ED Treatments / Results  Labs (all labs ordered are listed, but only abnormal results are displayed) Labs Reviewed  COMPREHENSIVE METABOLIC PANEL - Abnormal; Notable for the following:       Result Value   CO2 20 (*)    Glucose, Bld 101 (*)    Alkaline Phosphatase 33 (*)    All other components within normal limits  CBC - Abnormal; Notable for the following:    MCHC 36.4 (*)    All other components within normal limits  URINALYSIS, ROUTINE W REFLEX MICROSCOPIC - Abnormal; Notable for the following:    APPearance HAZY (*)    Specific Gravity, Urine 1.032 (*)    Ketones, ur 5 (*)    All other components within normal limits  LIPASE, BLOOD  I-STAT BETA HCG BLOOD, ED ([facility], [facility], [facility] ONLY)    EKG  EKG Interpretation None       Radiology No results found.  Procedures Procedures (including critical care time)  Medications Ordered in ED Medications  morphine 4 MG/ML injection 4 mg (4 mg Intravenous Given 09/07/17 0351)  ondansetron (ZOFRAN) injection 4 mg (4 mg Intravenous Given 09/07/17 0351)  gi cocktail (Maalox,Lidocaine,Donnatal) (30 mLs Oral Given 09/07/17 0351)     Initial Impression / Assessment and Plan / ED Course  I have reviewed the  triage vital signs and the nursing notes.  Pertinent labs & imaging results that were available during my care of the patient were reviewed by me and considered in my medical decision making (see chart for details).     Patient presents with upper abdominal pain. History of GERD. She is nontoxic on exam. Vital signs reassuring.  Basic labwork obtained and largely reassuring. Lipase and hepatic function testing are normal. Patient much improved after pain and nausea medication and a GI cocktail. Will switch patient to the protonix.  Patient is able to tolerate fluids without difficulty.  After history, exam, and medical workup I feel the patient has been appropriately medically screened and is safe for  discharge home. Pertinent diagnoses were discussed with the patient. Patient was given return precautions.   Final Clinical Impressions(s) / ED Diagnoses   Final diagnoses:  Upper abdominal pain    New Prescriptions New Prescriptions   PANTOPRAZOLE (PROTONIX) 20 MG TABLET    Take 1 tablet (20 mg total) by mouth daily.     Shon BatonHorton, Duglas Heier F, MD 09/07/17 [phone]

## 2018-04-07 ENCOUNTER — Emergency Department (HOSPITAL_COMMUNITY)
Admission: EM | Admit: 2018-04-07 | Discharge: 2018-04-07 | Disposition: A | Discharge status: Home / Self Care | Payer: Medicaid Other | Attending: Emergency Medicine | Admitting: Emergency Medicine

## 2018-04-07 ENCOUNTER — Encounter (HOSPITAL_COMMUNITY): Payer: Self-pay | Admitting: Emergency Medicine

## 2018-04-07 DIAGNOSIS — R1012 Left upper quadrant pain: Secondary | ICD-10-CM | POA: Diagnosis present

## 2018-04-07 DIAGNOSIS — Z79899 Other long term (current) drug therapy: Secondary | ICD-10-CM | POA: Insufficient documentation

## 2018-04-07 DIAGNOSIS — R1013 Epigastric pain: Secondary | ICD-10-CM | POA: Diagnosis not present

## 2018-04-07 DIAGNOSIS — R110 Nausea: Secondary | ICD-10-CM | POA: Diagnosis not present

## 2018-04-07 LAB — CBC
HEMATOCRIT: 40.3 % (ref 36.0–46.0)
HEMOGLOBIN: 14.1 g/dL (ref 12.0–15.0)
MCH: 31.4 pg (ref 26.0–34.0)
MCHC: 35 g/dL (ref 30.0–36.0)
MCV: 89.8 fL (ref 78.0–100.0)
Platelets: 283 10*3/uL (ref 150–400)
RBC: 4.49 MIL/uL (ref 3.87–5.11)
RDW: 12.3 % (ref 11.5–15.5)
WBC: 8.9 10*3/uL (ref 4.0–10.5)

## 2018-04-07 LAB — COMPREHENSIVE METABOLIC PANEL
ALBUMIN: 3.9 g/dL (ref 3.5–5.0)
ALK PHOS: 34 U/L — AB (ref 38–126)
ALT: 16 U/L (ref 14–54)
ANION GAP: 11 (ref 5–15)
AST: 23 U/L (ref 15–41)
BILIRUBIN TOTAL: 0.6 mg/dL (ref 0.3–1.2)
BUN: 14 mg/dL (ref 6–20)
CALCIUM: 8.8 mg/dL — AB (ref 8.9–10.3)
CO2: 20 mmol/L — AB (ref 22–32)
Chloride: 107 mmol/L (ref 101–111)
Creatinine, Ser: 0.54 mg/dL (ref 0.44–1.00)
GFR calc Af Amer: >60 mL/min (ref >60)
GFR calc non Af Amer: >60 mL/min (ref >60)
GLUCOSE: 95 mg/dL (ref 65–99)
POTASSIUM: 3.9 mmol/L (ref 3.5–5.1)
SODIUM: 138 mmol/L (ref 135–145)
Total Protein: 7.8 g/dL (ref 6.5–8.1)

## 2018-04-07 LAB — URINALYSIS, ROUTINE W REFLEX MICROSCOPIC
Bilirubin Urine: NEGATIVE
Glucose, UA: NEGATIVE mg/dL
Ketones, ur: NEGATIVE mg/dL
Leukocytes, UA: NEGATIVE
Nitrite: NEGATIVE
PROTEIN: 30 mg/dL — AB
SPECIFIC GRAVITY, URINE: 1.028 (ref 1.005–1.030)
pH: 5 (ref 5.0–8.0)

## 2018-04-07 LAB — I-STAT BETA HCG BLOOD, ED (MC, WL, AP ONLY)

## 2018-04-07 LAB — LIPASE, BLOOD: Lipase: 27 U/L (ref 11–51)

## 2018-04-07 MED ORDER — SUCRALFATE 1 G PO TABS: 1.0000 g | ORAL_TABLET | Freq: Three times a day (TID) | ORAL | 0 refills | Status: DC

## 2018-04-07 MED ORDER — SODIUM CHLORIDE 0.9 % IV BOLUS
1000.0000 mL | Freq: Once | INTRAVENOUS | Status: AC
  Administered 2018-04-07: 1000 mL via INTRAVENOUS

## 2018-04-07 MED ORDER — DICYCLOMINE HCL 10 MG/ML IM SOLN
20.0000 mg | Freq: Once | INTRAMUSCULAR | Status: AC
  Administered 2018-04-07: 20 mg via INTRAMUSCULAR
  Filled 2018-04-07: qty 2

## 2018-04-07 MED ORDER — ONDANSETRON HCL 4 MG/2ML IJ SOLN
4.0000 mg | Freq: Once | INTRAMUSCULAR | Status: AC | PRN
  Administered 2018-04-07: 4 mg via INTRAVENOUS
  Filled 2018-04-07: qty 2

## 2018-04-07 MED ORDER — METOCLOPRAMIDE HCL 5 MG/ML IJ SOLN
10.0000 mg | Freq: Once | INTRAMUSCULAR | Status: AC
  Administered 2018-04-07: 10 mg via INTRAVENOUS
  Filled 2018-04-07: qty 2

## 2018-04-07 MED ORDER — GI COCKTAIL ~~LOC~~
30.0000 mL | Freq: Once | ORAL | Status: AC
  Administered 2018-04-07: 30 mL via ORAL
  Filled 2018-04-07: qty 30

## 2018-04-07 MED ORDER — FENTANYL CITRATE (PF) 100 MCG/2ML IJ SOLN
50.0000 ug | INTRAMUSCULAR | Status: AC | PRN
  Administered 2018-04-07 (×2): 50 ug via INTRAVENOUS
  Filled 2018-04-07 (×2): qty 2

## 2018-04-07 MED ORDER — DIPHENHYDRAMINE HCL 50 MG/ML IJ SOLN
25.0000 mg | Freq: Once | INTRAMUSCULAR | Status: AC
  Administered 2018-04-07: 25 mg via INTRAVENOUS
  Filled 2018-04-07: qty 1

## 2018-04-07 MED ORDER — ONDANSETRON HCL 4 MG/2ML IJ SOLN
4.0000 mg | Freq: Once | INTRAMUSCULAR | Status: AC
  Administered 2018-04-07: 4 mg via INTRAVENOUS
  Filled 2018-04-07: qty 2

## 2018-04-07 MED ORDER — DICYCLOMINE HCL 20 MG PO TABS: 20.0000 mg | ORAL_TABLET | Freq: Two times a day (BID) | ORAL | 0 refills | Status: DC

## 2018-04-07 NOTE — ED Triage Notes (Signed)
Pt comes to ed, via home, with husband, pt c/o of nausea and abdominal pain lower mid area. Pt also has anxiety and had a sob panic attack in lobby a few mins  Ago. Pt on monitor and ok now.

## 2018-04-07 NOTE — ED Provider Notes (Signed)
[facility]-EMERGENCY DEPT Provider Note   CSN: [id number] Arrival date & time: 04/07/18  0207     History   Chief Complaint Chief Complaint  Patient presents with  . Abdominal Pain  . Anxiety    HPI Leah Ford is a 22 y.o. female.  HPI  Patient is a 22 year old female with a history of recurrent abdominal pain, gastritis, and irregular menstrual cycles presenting for epigastric to left upper quadrant pain.  Patient reports that this episode began approximately 6 PM last night.  Patient reports it is intermittent, but is resolved on evaluation after she received antiemetics and analgesia in the emergency department.  Patient reports she has been nausea without vomiting.  Patient reports that this pain is similar to her previous episodes, but does not radiate into the back as it has previously.  Patient denies any fevers, chills, lower abdominal pain, dysuria, flank pain, vaginal discharge or abnormal vaginal bleeding.  Patient reports that her primary care provider has her taking a proton pump inhibitor.  Patient previously seen by pediatric gastroenterology, but patient does not currently have a gastroenterologist.  No remedies tried for symptoms prior to arrival.  Of note, patient reports she had an episode of "anxiety" on arrival to the emergency department.  Patient reports it is completely resolved at this time.  Patient reports that she experiences this when she gets sharp abdominal pain, and the episode that occurred this morning was consistent with prior.  Patient denying chest pain, shortness of breath, or palpitations.  Past Medical History:  Diagnosis Date  . Abdominal pain, chronic, epigastric   . Gastritis   . Gastroesophageal reflux     Patient Active Problem List   Diagnosis Date Noted  . Screening examination for STD (sexually transmitted disease) 12/24/2016  . Irregular periods/menstrual cycles 12/24/2016  . H/O sleep apnea 10/26/2012  .  Simple constipation 09/30/2012  . Belching 09/30/2012  . Epigastric abdominal pain   . Gastroesophageal reflux     History reviewed. No pertinent surgical history.   OB History    Gravida  0   Para  0   Term  0   Preterm  0   AB  0   Living  0     SAB  0   TAB  0   Ectopic  0   Multiple  0   Live Births  0            Home Medications    Prior to Admission medications   Medication Sig Start Date End Date Taking? Authorizing Provider  Norgestimate-Ethinyl Estradiol Triphasic (TRI-LO-ESTARYLLA) 0.18/0.215/0.25 MG-25 MCG tab Take 1 tablet by mouth daily.   Yes [provider]  omeprazole (PRILOSEC) 40 MG capsule Take 40 mg by mouth daily as needed (gas and indigestion).    Yes [provider]  propranolol (INDERAL) 20 MG tablet Take 1 tablet (20 mg total) by mouth 2 (two) times daily as needed (panic). 04/18/16  Yes Lyndal Pulley, MD  esomeprazole (NEXIUM) 40 MG capsule Take 1 capsule (40 mg total) by mouth daily before breakfast. Patient not taking: Reported on 04/07/2018 09/26/13 09/26/14  Jon Gills, MD  famotidine (PEPCID) 20 MG tablet Take 1 tablet (20 mg total) by mouth 2 (two) times daily. Patient not taking: Reported on 12/24/2016 10/13/14   Piepenbrink, Victorino Dike, PA-C  LORazepam (ATIVAN) 1 MG tablet Take 1 tablet (1 mg total) by mouth every 4 (four) hours as needed (nausea, retching attacks). Patient not  taking: Reported on 12/24/2016 09/21/15   Arthor Captain, PA-C  pantoprazole (PROTONIX) 20 MG tablet Take 1 tablet (20 mg total) by mouth daily. Patient not taking: Reported on 04/07/2018 09/07/17   Horton, Mayer Masker, MD  promethazine (PHENERGAN) 25 MG suppository Place 1 suppository (25 mg total) rectally every 6 (six) hours as needed for nausea or vomiting. Patient not taking: Reported on 12/24/2016 09/21/15   Arthor Captain, PA-C  sucralfate (CARAFATE) 1 GM/10ML suspension Take 10 mLs (1 g total) by mouth 4 (four) times daily -  with  meals and at bedtime. Patient not taking: Reported on 12/24/2016 09/21/15   Antony Madura, PA-C    Family History Family History  Problem Relation Age of Onset  . Diabetes Sister   . Diabetes Sister   . Cholelithiasis Mother   . Diabetes Mother   . Hypertension Mother   . Hyperlipidemia Mother   . GER disease Father   . Cholelithiasis Father   . Hyperlipidemia Father   . Cancer Paternal Grandmother        unsure which cancer per pt    Social History Social History   Tobacco Use  . Smoking status: Never Smoker  . Smokeless tobacco: Never Used  Substance Use Topics  . Alcohol use: No  . Drug use: No     Allergies   Patient has no known allergies.   Review of Systems Review of Systems  Constitutional: Negative for chills and fever.  HENT: Negative for congestion, rhinorrhea, sinus pain and sore throat.   Eyes: Negative for visual disturbance.  Respiratory: Negative for cough, chest tightness and shortness of breath.   Cardiovascular: Negative for chest pain.  Gastrointestinal: Positive for abdominal pain and nausea. Negative for diarrhea and vomiting.  Genitourinary: Negative for dysuria and flank pain.  Musculoskeletal: Negative for back pain and myalgias.  Skin: Negative for rash.  Neurological: Negative for dizziness, light-headedness and headaches.  Psychiatric/Behavioral: The patient is nervous/anxious.      Physical Exam Updated Vital Signs BP 106/73   Pulse 77   Temp 98.5 F (36.9 C) (Oral)   Resp 16   SpO2 99%   Physical Exam  Constitutional: She appears well-developed and well-nourished. No distress.  HENT:  Head: Normocephalic and atraumatic.  Mouth/Throat: Oropharynx is clear and moist.  Eyes: Pupils are equal, round, and reactive to light. Conjunctivae and EOM are normal.  Neck: Normal range of motion. Neck supple.  Cardiovascular: Normal rate, regular rhythm, S1 normal and S2 normal.  No murmur heard. Pulmonary/Chest: Effort normal and  breath sounds normal. She has no wheezes. She has no rales.  Abdominal: Soft. She exhibits no distension. There is tenderness. There is no guarding.  Discomfort to palpation of epigastrium and left upper quadrant, that resolves when abdomen is not palpated.  Musculoskeletal: Normal range of motion. She exhibits no edema or deformity.  Lymphadenopathy:    She has no cervical adenopathy.  Neurological: She is alert.  Cranial nerves grossly intact. Patient moves extremities symmetrically and with good coordination.  Skin: Skin is warm and dry. No rash noted. No erythema.  Psychiatric: She has a normal mood and affect. Her behavior is normal. Judgment and thought content normal.  Nursing note and vitals reviewed.    ED Treatments / Results  Labs (all labs ordered are listed, but only abnormal results are displayed) Labs Reviewed  COMPREHENSIVE METABOLIC PANEL - Abnormal; Notable for the following components:      Result Value   CO2 20 (*)  Calcium 8.8 (*)    Alkaline Phosphatase 34 (*)    All other components within normal limits  URINALYSIS, ROUTINE W REFLEX MICROSCOPIC - Abnormal; Notable for the following components:   APPearance CLOUDY (*)    Hgb urine dipstick LARGE (*)    Protein, ur 30 (*)    Bacteria, UA RARE (*)    Squamous Epithelial / LPF 6-30 (*)    All other components within normal limits  LIPASE, BLOOD  CBC  I-STAT BETA HCG BLOOD, ED ([facility], [facility], [facility] ONLY)    EKG None  Radiology No results found.  Procedures Procedures (including critical care time)  Medications Ordered in ED Medications  metoCLOPramide (REGLAN) injection 10 mg (has no administration in time range)  diphenhydrAMINE (BENADRYL) injection 25 mg (has no administration in time range)  dicyclomine (BENTYL) injection 20 mg (has no administration in time range)  ondansetron (ZOFRAN) injection 4 mg (4 mg Intravenous Given 04/07/18 0311)  sodium chloride 0.9 % bolus 1,000 mL (0 mLs Intravenous Stopped  04/07/18 0523)  fentaNYL (SUBLIMAZE) injection 50 mcg (50 mcg Intravenous Given 04/07/18 0754)  ondansetron (ZOFRAN) injection 4 mg (4 mg Intravenous Given 04/07/18 0529)  gi cocktail (Maalox,Lidocaine,Donnatal) (30 mLs Oral Given 04/07/18 0707)     Initial Impression / Assessment and Plan / ED Course  I have reviewed the triage vital signs and the nursing notes.  Pertinent labs & imaging results that were available during my care of the patient were reviewed by me and considered in my medical decision making (see chart for details).  Clinical Course as of Apr 07 1042  Wed Apr 07, 2018  0830 Reassessed and feeling more nauseous. Patient had no pain prior to GI cocktail and has empty stomach. Will PO challenge with crackers.   [AM]  [phone] Patient reassessed.  Patient reports that she is feeling that her stomach is "gurgling".  Patient reports she continues to have some nausea, but has tolerated an entire cup of water.  Will provide Reglan, Benadryl, and Bentyl and reassess.   [AM]  1001 Patient reassessed.  Pulse 92 on my examination. Patient reports she is feeling more anxious, likely secondary to Reglan, however it was coadministered with Benadryl.   [AM]    Clinical Course User Index [AM] Elisha PonderMurray, Allan Minotti B, PA-C    Patient is nontoxic-appearing, afebrile, and in no acute distress.  Abdomen is benign, and there is no evidence of sepsis or peritoneal signs.  Patient with epigastric to left upper quadrant abdominal pain consistent with prior per patient's report.  Patient has on GERD therapy, but does not have prior history of endoscopy.  Differential diagnosis includes gastritis versus peptic ulcer disease versus gastroparesis, versus cholelithiasis.  Doubt pancreatitis, as patient has normal lipase.  Pain completely resolved on my examination.  Patient tolerating p.o. in emergency department.  I discussed with patient that she may need further workup such as right upper quadrant ultrasound or  endoscopy, but these can be performed outpatient if symptoms are controlled.  Patient to follow-up with her primary care provider regarding obtaining GI referral.  Patient given symptomatic therapy for gastritis with Carafate and Bentyl, instructed to follow with PCP.  Return precautions for any tractable nausea or vomiting, worsening abdominal pain, fever or chills with abdominal pain.  Patient is in understanding and agrees with plan of care.  Final Clinical Impressions(s) / ED Diagnoses   Final diagnoses:  Epigastric pain  Nausea    ED Discharge Orders  Ordered    dicyclomine (BENTYL) 20 MG tablet  2 times daily     04/07/18 0930    sucralfate (CARAFATE) 1 g tablet  3 times daily with meals & bedtime     04/07/18 0930       Elisha Ponder, PA-C 04/07/18 1044    Charlynne Pander, MD 04/07/18 [phone]

## 2018-04-07 NOTE — Discharge Instructions (Signed)
Please see the information and instructions below regarding your visit.  Your diagnoses today include:  1. Epigastric pain   2. Nausea     Your exam and testing today is reassuring that there is not a condition causing your abdominal pain that we immediately need to intervene on at this time.   Abdominal (belly) pain can be caused by many things. Your caregiver performed an examination and possibly ordered blood/urine tests and imaging (CT scan, x-rays, ultrasound). Many cases can be observed and treated at home after initial evaluation in the emergency department. Even though you are being discharged home, abdominal pain can be unpredictable. Therefore, you need a repeated exam if your pain does not resolve, returns, or worsens. Most patients with abdominal pain don't have to be admitted to the hospital or have surgery, but serious problems like appendicitis and gallbladder attacks can start out as nonspecific pain. Many abdominal conditions cannot be diagnosed in one visit, so follow-up evaluations are very important.  Tests performed today include: Blood counts and electrolytes Blood tests to check liver and kidney function Blood tests to check pancreas function Urine test to look for infection and pregnancy (in women) Vital signs. See below for your results today.   See side panel of your discharge paperwork for testing performed today. Vital signs are listed at the bottom of these instructions.   Medications prescribed:    Take any prescribed medications only as prescribed, and any over the counter medications only as directed on the packaging.  Home care instructions:  Tr y eating, but start with foods that have a lot of fluid in them. Good examples are soup, Jell-O, and popsicles. If you do OK with those foods, you can try soft, bland foods, such as plain yogurt. Foods that are high in carbohydrates ("carbs"), like bread or saltine crackers, can help settle your stomach. Some people  also find that ginger helps with nausea. You should avoid foods that have a lot of fat in them. They can make nausea worse. Call your doctor if your symptoms come back when you try to eat.  Please follow any educational materials contained in this packet.   Follow-up instructions: Please follow-up with your primary care provider in one week for further evaluation of your symptoms if they are not completely improved.   Please follow up with gastroenterology.   Return instructions:  Please return to the Emergency Department if you experience worsening symptoms.  SEEK IMMEDIATE MEDICAL ATTENTION IF: The pain does not go away or becomes severe  A temperature above 101F develops  Repeated vomiting occurs (multiple episodes)  The pain becomes localized to portions of the abdomen. The right side could possibly be appendicitis. In an adult, the left lower portion of the abdomen could be colitis or diverticulitis.  Blood is being passed in stools or vomit (bright red or black tarry stools)  You develop chest pain, difficulty breathing, dizziness or fainting, or become confused, poorly responsive, or inconsolable (young children) If you have any other emergent concerns regarding your health  Additional Information:   Your vital signs today were: BP 106/73    Pulse 77    Temp 98.5 F (36.9 C) (Oral)    Resp 16    SpO2 99%  If your blood pressure (BP) was elevated on multiple readings during this visit above 130 for the top number or above 80 for the bottom number, please have this repeated by your primary care provider within one month. --------------  Thank  you for allowing us to participate in your care today.

## 2018-07-22 ENCOUNTER — Encounter: Payer: Medicaid Other | Attending: Gastroenterology | Admitting: Dietician

## 2018-07-22 ENCOUNTER — Encounter: Payer: Self-pay | Admitting: Dietician

## 2018-07-22 DIAGNOSIS — Z713 Dietary counseling and surveillance: Secondary | ICD-10-CM | POA: Diagnosis present

## 2018-07-22 DIAGNOSIS — K900 Celiac disease: Secondary | ICD-10-CM | POA: Diagnosis not present

## 2018-07-22 NOTE — Patient Instructions (Signed)
Resources:  CartridgeClearance.com.cyCeliac.org  Be aware of sources of cross contamination.  Pots, pans, colanders  Toaster  Double dipping (flour to sugar etc.)  Grill or other places at restaurants  When eating out:  Ask for a gluten free menu or what the gluten free options are  Ask how they prepare the food to see sources of cross contamination.  Medication and vitamins can contain gluten.  Call the manufacturer or ask your pharmacist if questions.  Try Udi bread (keep most in the freezer until ready to use). Soy sauce has gluten but there is gluten free soy sauce as well as Tamari and Bragg"s Liquid Amino's

## 2018-07-22 NOTE — Progress Notes (Signed)
  Medical Nutrition Therapy:  Appt start time: 1275 end time:  1630.   Assessment:  Primary concerns today: Patient is here today alone.  She has newly diagnosed celiac disease. Her weight today was 103 lbs and ranges from 102-107 lbs.  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  She lives in a house with others and she does her own cooking but often eats out.  Preferred Learning Style:   No preference indicated   Learning Readiness:   Ready  MEDICATIONS: see list to include Vitamin D   DIETARY INTAKE:  24-hr recall:  B ( AM): yogurt or lucky charms, milk, banana  Snk ( AM): none  L ( PM): chicken tenders, ranch OR tuna or meat, corn tortilla, vegetables Snk ( PM): candy (snickers or twix) D ( PM): corn tortillas, meat, vegetables OR tostada, meat, beans, ham Snk ( PM): none Beverages: water, milk  Usual physical activity: was going to the gym but now no time with work  Progress Towards Goal(s):  In progress.   Nutritional Diagnosis:  NB-1.1 Food and nutrition-related knowledge deficit As related to nutrition for celiac disease.  As evidenced by patient report and diet history..    Intervention:  Nutrition education related to celiac disease.  Discussed foods to avoid, include, cross contamination, and eating out.  Resources:  https://gomez-solis.com/  Be aware of sources of cross contamination.  Pots, pans, colanders  Toaster  Double dipping (flour to sugar etc.)  Grill or other places at restaurants  When eating out:  Ask for a gluten free menu or what the gluten free options are  Ask how they prepare the food to see sources of cross contamination.  Medication and vitamins can contain gluten.  Call the manufacturer or ask your  pharmacist if questions.  Try Udi bread (keep most in the freezer until ready to use). Soy sauce has gluten but there is gluten free soy sauce as well as Tamari and Bragg"s Liquid Amino's   Teaching Method Utilized:  Visual Auditory  Handouts given during visit include:  Celiac Nutrition Therapy from AND  Celiac Label reading from AND  Celiac tips from AND  Barriers to learning/adherence to lifestyle change: none  Demonstrated degree of understanding via:  Teach Back   Monitoring/Evaluation:  Dietary intake, exercise, label reading, and body weight prn.

## 2019-06-06 ENCOUNTER — Emergency Department (HOSPITAL_COMMUNITY)
Admission: EM | Admit: 2019-06-06 | Discharge: 2019-06-06 | Disposition: A | Discharge status: Home / Self Care | Payer: Medicaid Other | Attending: Emergency Medicine | Admitting: Emergency Medicine

## 2019-06-06 ENCOUNTER — Other Ambulatory Visit (HOSPITAL_COMMUNITY): Payer: Self-pay

## 2019-06-06 ENCOUNTER — Other Ambulatory Visit: Payer: Self-pay

## 2019-06-06 ENCOUNTER — Encounter (HOSPITAL_COMMUNITY): Payer: Self-pay | Admitting: Emergency Medicine

## 2019-06-06 DIAGNOSIS — R1010 Upper abdominal pain, unspecified: Secondary | ICD-10-CM | POA: Insufficient documentation

## 2019-06-06 DIAGNOSIS — Z79899 Other long term (current) drug therapy: Secondary | ICD-10-CM | POA: Insufficient documentation

## 2019-06-06 LAB — URINALYSIS, ROUTINE W REFLEX MICROSCOPIC
Bilirubin Urine: NEGATIVE
Glucose, UA: NEGATIVE mg/dL
Ketones, ur: NEGATIVE mg/dL
Leukocytes,Ua: NEGATIVE
Nitrite: NEGATIVE
Protein, ur: NEGATIVE mg/dL
RBC / HPF: >50 RBC/hpf — ABNORMAL HIGH (ref 0–5)
Specific Gravity, Urine: 1.024 (ref 1.005–1.030)
pH: 5 (ref 5.0–8.0)

## 2019-06-06 LAB — CBC
HCT: 40.9 % (ref 36.0–46.0)
Hemoglobin: 14.3 g/dL (ref 12.0–15.0)
MCH: 31.4 pg (ref 26.0–34.0)
MCHC: 35 g/dL (ref 30.0–36.0)
MCV: 89.9 fL (ref 80.0–100.0)
Platelets: 295 10*3/uL (ref 150–400)
RBC: 4.55 MIL/uL (ref 3.87–5.11)
RDW: 11.4 % — ABNORMAL LOW (ref 11.5–15.5)
WBC: 7.2 10*3/uL (ref 4.0–10.5)
nRBC: 0 % (ref 0.0–0.2)

## 2019-06-06 LAB — COMPREHENSIVE METABOLIC PANEL
ALT: 22 U/L (ref 0–44)
AST: 21 U/L (ref 15–41)
Albumin: 3.9 g/dL (ref 3.5–5.0)
Alkaline Phosphatase: 40 U/L (ref 38–126)
Anion gap: 10 (ref 5–15)
BUN: 8 mg/dL (ref 6–20)
CO2: 20 mmol/L — ABNORMAL LOW (ref 22–32)
Calcium: 9.2 mg/dL (ref 8.9–10.3)
Chloride: 108 mmol/L (ref 98–111)
Creatinine, Ser: 0.6 mg/dL (ref 0.44–1.00)
GFR calc Af Amer: >60 mL/min (ref >60)
GFR calc non Af Amer: >60 mL/min (ref >60)
Glucose, Bld: 108 mg/dL — ABNORMAL HIGH (ref 70–99)
Potassium: 3.9 mmol/L (ref 3.5–5.1)
Sodium: 138 mmol/L (ref 135–145)
Total Bilirubin: 0.4 mg/dL (ref 0.3–1.2)
Total Protein: 7.3 g/dL (ref 6.5–8.1)

## 2019-06-06 LAB — I-STAT BETA HCG BLOOD, ED (MC, WL, AP ONLY): I-stat hCG, quantitative: <5 m[IU]/mL (ref <5)

## 2019-06-06 LAB — LIPASE, BLOOD: Lipase: 25 U/L (ref 11–51)

## 2019-06-06 MED ORDER — SODIUM CHLORIDE 0.9% FLUSH
3.0000 mL | Freq: Once | INTRAVENOUS | Status: AC
  Administered 2019-06-06: 3 mL via INTRAVENOUS

## 2019-06-06 MED ORDER — ONDANSETRON HCL 4 MG PO TABS: 4.0000 mg | ORAL_TABLET | Freq: Four times a day (QID) | ORAL | 0 refills | Status: AC

## 2019-06-06 MED ORDER — DICYCLOMINE HCL 10 MG PO CAPS
10.0000 mg | ORAL_CAPSULE | Freq: Once | ORAL | Status: AC
  Administered 2019-06-06: 10 mg via ORAL
  Filled 2019-06-06: qty 1

## 2019-06-06 MED ORDER — ALUM & MAG HYDROXIDE-SIMETH 200-200-20 MG/5ML PO SUSP
30.0000 mL | Freq: Once | ORAL | Status: AC
  Administered 2019-06-06: 30 mL via ORAL
  Filled 2019-06-06: qty 30

## 2019-06-06 MED ORDER — SODIUM CHLORIDE 0.9 % IV BOLUS
1000.0000 mL | Freq: Once | INTRAVENOUS | Status: AC
  Administered 2019-06-06: 1000 mL via INTRAVENOUS

## 2019-06-06 MED ORDER — ONDANSETRON HCL 4 MG/2ML IJ SOLN
4.0000 mg | Freq: Once | INTRAMUSCULAR | Status: AC
  Administered 2019-06-06: 4 mg via INTRAVENOUS
  Filled 2019-06-06: qty 2

## 2019-06-06 MED ORDER — DICYCLOMINE HCL 20 MG PO TABS: 20.0000 mg | ORAL_TABLET | Freq: Two times a day (BID) | ORAL | 0 refills | Status: DC

## 2019-06-06 NOTE — Discharge Instructions (Signed)
I have provided a prescription for Bentyl to help with your symptoms, please take this as prescribed.  I have also provided a prescription for Zofran, this will help with the nausea.  I recommend you follow-up with your primary care physician in 1 week for reevaluation of your symptoms.  If you experience any fever, worsening symptoms please return to the emergency department.

## 2019-06-06 NOTE — ED Triage Notes (Signed)
Pt c/o mid abd pain, intermittent cramping/burning +nausea, +diarrhea x 7 in last 24 hours.

## 2019-06-06 NOTE — ED Provider Notes (Signed)
[facility] EMERGENCY DEPARTMENT Provider Note   CSN: [id number] Arrival date & time: 06/06/19  0540    History   Chief Complaint Chief Complaint  Patient presents with   Abdominal Pain    HPI Leah Ford is a 23 y.o. female.     23 y.o female with a PMH of GERD presents to the ED with a chief complaint of epigastric abdominal pain x yesterday. Patient describes an intermittent burning sensation to her epigastric region with no radiation. She reports the pain had improved after taking tylenol but then later returned with worse pain. Patient does report the pain is worse at night. She also endorses nausea. Patient also reports 7 episodes of diarrhea, no blood. Her LMP began yesterday. She denies any emesis, fever, gynecological complaints or urinary symptoms.      Past Medical History:  Diagnosis Date   Abdominal pain, chronic, epigastric    Celiac disease    Gastritis    Gastroesophageal reflux     Patient Active Problem List   Diagnosis Date Noted   Screening examination for STD (sexually transmitted disease) 12/24/2016   Irregular periods/menstrual cycles 12/24/2016   H/O sleep apnea 10/26/2012   Simple constipation 09/30/2012   Belching 09/30/2012   Epigastric abdominal pain    Gastroesophageal reflux     History reviewed. No pertinent surgical history.   OB History    Gravida  0   Para  0   Term  0   Preterm  0   AB  0   Living  0     SAB  0   TAB  0   Ectopic  0   Multiple  0   Live Births  0            Home Medications    Prior to Admission medications   Medication Sig Start Date End Date Taking? Authorizing Provider  cholecalciferol (VITAMIN D) 1000 units tablet Take 1,000 Units by mouth daily.    [provider]  dicyclomine (BENTYL) 20 MG tablet Take 1 tablet (20 mg total) by mouth 2 (two) times daily. Patient not taking: Reported on 07/22/2018 04/07/18   Langston Masker B, PA-C    esomeprazole (NEXIUM) 40 MG capsule Take 1 capsule (40 mg total) by mouth daily before breakfast. Patient not taking: Reported on 04/07/2018 09/26/13 09/26/14  Oletha Blend, MD  famotidine (PEPCID) 20 MG tablet Take 1 tablet (20 mg total) by mouth 2 (two) times daily. Patient not taking: Reported on 12/24/2016 10/13/14   Piepenbrink, Anderson Malta, PA-C  LORazepam (ATIVAN) 1 MG tablet Take 1 tablet (1 mg total) by mouth every 4 (four) hours as needed (nausea, retching attacks). 09/21/15   Margarita Mail, PA-C  Norgestimate-Ethinyl Estradiol Triphasic (TRI-LO-ESTARYLLA) 0.18/0.215/0.25 MG-25 MCG tab Take 1 tablet by mouth daily.    [provider]  omeprazole (PRILOSEC) 40 MG capsule Take 40 mg by mouth daily as needed (gas and indigestion).     [provider]  pantoprazole (PROTONIX) 20 MG tablet Take 1 tablet (20 mg total) by mouth daily. Patient not taking: Reported on 04/07/2018 09/07/17   Horton, Barbette Hair, MD  promethazine (PHENERGAN) 25 MG suppository Place 1 suppository (25 mg total) rectally every 6 (six) hours as needed for nausea or vomiting. Patient not taking: Reported on 12/24/2016 09/21/15   Margarita Mail, PA-C  propranolol (INDERAL) 20 MG tablet Take 1 tablet (20 mg total) by mouth 2 (two) times daily as needed (panic). 04/18/16  Leo Grosser, MD  sucralfate (CARAFATE) 1 g tablet Take 1 tablet (1 g total) by mouth 4 (four) times daily -  with meals and at bedtime for 7 days. 04/07/18 04/14/18  Albesa Seen, PA-C    Family History Family History  Problem Relation Age of Onset   Diabetes Sister    Diabetes Sister    Cholelithiasis Mother    Diabetes Mother    Hypertension Mother    Hyperlipidemia Mother    GER disease Father    Cholelithiasis Father    Hyperlipidemia Father    Cancer Paternal Grandmother        unsure which cancer per pt    Social History Social History   Tobacco Use   Smoking status: Never Smoker   Smokeless tobacco:  Never Used  Substance Use Topics   Alcohol use: No   Drug use: No     Allergies   Gluten meal   Review of Systems Review of Systems  Constitutional: Negative for chills and fever.  HENT: Negative for ear pain, sinus pressure, sinus pain and sore throat.   Eyes: Negative for pain and visual disturbance.  Respiratory: Negative for cough and shortness of breath.   Cardiovascular: Negative for chest pain and palpitations.  Gastrointestinal: Positive for abdominal pain and diarrhea. Negative for vomiting.  Genitourinary: Negative for decreased urine volume, dysuria, flank pain, hematuria, vaginal bleeding and vaginal discharge.  Musculoskeletal: Negative for arthralgias and back pain.  Skin: Negative for color change and rash.  Neurological: Negative for seizures and syncope.  All other systems reviewed and are negative.    Physical Exam Updated Vital Signs BP 109/72    Pulse 82    Temp 98.1 F (36.7 C) (Oral)    Resp 18    Ht 4' 11"  (1.499 m)    Wt 47.2 kg    LMP 06/06/2019    SpO2 97%    BMI 21.01 kg/m   Physical Exam Vitals signs and nursing note reviewed.  Constitutional:      General: She is not in acute distress.    Appearance: She is well-developed.     Comments: Non ill appearing.   HENT:     Head: Normocephalic and atraumatic.     Mouth/Throat:     Pharynx: No oropharyngeal exudate.  Eyes:     Pupils: Pupils are equal, round, and reactive to light.  Neck:     Musculoskeletal: Normal range of motion.  Cardiovascular:     Rate and Rhythm: Regular rhythm.     Heart sounds: Normal heart sounds.  Pulmonary:     Effort: Pulmonary effort is normal. No respiratory distress.     Breath sounds: Normal breath sounds.  Abdominal:     General: Bowel sounds are normal. There is no distension.     Palpations: Abdomen is soft.     Tenderness: There is abdominal tenderness in the right upper quadrant and epigastric area. There is no right CVA tenderness, left CVA  tenderness, guarding or rebound. Negative signs include McBurney's sign.  Musculoskeletal:        General: No tenderness or deformity.     Right lower leg: No edema.     Left lower leg: No edema.  Skin:    General: Skin is warm and dry.  Neurological:     Mental Status: She is alert and oriented to person, place, and time.      ED Treatments / Results  Labs (all labs ordered are listed,  but only abnormal results are displayed) Labs Reviewed  COMPREHENSIVE METABOLIC PANEL - Abnormal; Notable for the following components:      Result Value   CO2 20 (*)    Glucose, Bld 108 (*)    All other components within normal limits  CBC - Abnormal; Notable for the following components:   RDW 11.4 (*)    All other components within normal limits  URINALYSIS, ROUTINE W REFLEX MICROSCOPIC - Abnormal; Notable for the following components:   APPearance HAZY (*)    Hgb urine dipstick LARGE (*)    RBC / HPF >50 (*)    Bacteria, UA RARE (*)    All other components within normal limits  LIPASE, BLOOD  I-STAT BETA HCG BLOOD, ED ([facility], [facility], [facility] ONLY)    EKG None  Radiology No results found.  Procedures Procedures (including critical care time)  Medications Ordered in ED Medications  sodium chloride flush (NS) 0.9 % injection 3 mL (3 mLs Intravenous Given 06/06/19 0734)  alum & mag hydroxide-simeth (MAALOX/MYLANTA) 200-200-20 MG/5ML suspension 30 mL (30 mLs Oral Given 06/06/19 0734)  sodium chloride 0.9 % bolus 1,000 mL (1,000 mLs Intravenous New Bag/Given 06/06/19 0733)  ondansetron (ZOFRAN) injection 4 mg (4 mg Intravenous Given 06/06/19 0733)     Initial Impression / Assessment and Plan / ED Course  I have reviewed the triage vital signs and the nursing notes.  Pertinent labs & imaging results that were available during my care of the patient were reviewed by me and considered in my medical decision making (see chart for details).      Patient with epigastric pain x yesterday, multiple  episodes of diarrhea (7) prior to arrival.  Has taken some Tylenol which helped with her symptoms, however they return after medication had wear off.  Laboratory results showed CBC with no leukocytosis, hemoglobin is within normal limits.  CMP showed no electrolyte abnormality, no anion gap.  hCG was negative, lipase was normal.  UA showed large amount of hemoglobin on it, patient reports she recently started her menstrual cycle this morning.  Denies any urinary symptoms.  No fevers, vomiting, does endorse some nausea.  Denies any gynecological complaints, low suspicion for any torsion as patient reports that her pain is on the upper worse with eating.  Some suspicion for gallbladder pathology however LFTs are unremarkable along with white blood cell count is unremarkable and patient is afebrile.  Given a liter of fluids, Zofran, pain medication to help with her symptoms.  10:58 AM patient reevaluated after giving Bentyl, she reports her pain is now a 1 out of 10.  Patient's labs are within normal limits, no abnormality on LFTs, vital signs are stable.  No emesis episodes during ER visit.  ReSound of the right upper quadrant was ordered for patient, however, she reports she is feeling much better and would like to complete this on an outpatient basis.  She does have good follow-up with PCP.  Patient will be sent home with Bentyl, Zofran to help with her symptoms.  Patient is advised to follow-up with her PCP in 1 week.  Patient with stable vital signs, afebrile, with improvement in symptoms stable for discharge.  Portions of this note were generated with [employer]. Dictation errors may occur despite best attempts at proofreading.  Final Clinical Impressions(s) / ED Diagnoses   Final diagnoses:  Pain of upper abdomen    ED Discharge Orders    None       Tausha Milhoan,  Beverley Fiedler, PA-C 06/18/19 1545    Pattricia Boss, MD 06/27/19 1426

## 2019-08-10 ENCOUNTER — Other Ambulatory Visit: Payer: Self-pay

## 2019-08-10 DIAGNOSIS — Z20822 Contact with and (suspected) exposure to covid-19: Secondary | ICD-10-CM

## 2019-08-12 LAB — NOVEL CORONAVIRUS, NAA: SARS-CoV-2, NAA: NOT DETECTED

## 2020-01-12 ENCOUNTER — Ambulatory Visit: Payer: 59 | Attending: Internal Medicine

## 2020-01-12 DIAGNOSIS — Z20822 Contact with and (suspected) exposure to covid-19: Secondary | ICD-10-CM | POA: Insufficient documentation

## 2020-01-13 LAB — NOVEL CORONAVIRUS, NAA: SARS-CoV-2, NAA: NOT DETECTED

## 2020-02-13 ENCOUNTER — Other Ambulatory Visit: Payer: Self-pay

## 2020-02-13 ENCOUNTER — Ambulatory Visit: Payer: Self-pay | Attending: Internal Medicine

## 2020-02-13 DIAGNOSIS — Z23 Encounter for immunization: Secondary | ICD-10-CM | POA: Insufficient documentation

## 2020-02-13 NOTE — Progress Notes (Signed)
   Covid-19 Vaccination Clinic  Name:  Leah Ford    MRN: [id number] DOB: Aug 24, 1996  02/13/2020  [name] was observed post Covid-19 immunization for 15 minutes without incidence. She was provided with Vaccine Information Sheet and instruction to access the V-Safe system.   [name] was instructed to call 911 with any severe reactions post vaccine: Marland Kitchen Difficulty breathing  . Swelling of your face and throat  . A fast heartbeat  . A bad rash all over your body  . Dizziness and weakness    Immunizations Administered    Name Date Dose VIS Date Route   Moderna COVID-19 Vaccine 02/13/2020  2:49 PM 0.5 mL 11/29/2019 Intramuscular   Manufacturer: Moderna   Lot: [id number]   [place]: 43014-840-39

## 2020-03-13 ENCOUNTER — Ambulatory Visit: Payer: Self-pay | Attending: Internal Medicine

## 2020-03-13 DIAGNOSIS — Z23 Encounter for immunization: Secondary | ICD-10-CM

## 2020-03-13 NOTE — Progress Notes (Signed)
   Covid-19 Vaccination Clinic  Name:  Leah Ford    MRN: [id number] DOB: March 15, 1996  03/13/2020  [name] was observed post Covid-19 immunization for 15 minutes without incident. She was provided with Vaccine Information Sheet and instruction to access the V-Safe system.   Ms. Gallop was instructed to call 911 with any severe reactions post vaccine: Marland Kitchen Difficulty breathing  . Swelling of face and throat  . A fast heartbeat  . A bad rash all over body  . Dizziness and weakness   Immunizations Administered    Name Date Dose VIS Date Route   Moderna COVID-19 Vaccine 03/13/2020  3:04 PM 0.5 mL 11/29/2019 Intramuscular   Manufacturer: Moderna   Lot: [id number]   [place]: [id number]

## 2020-10-02 ENCOUNTER — Other Ambulatory Visit: Payer: Self-pay

## 2020-10-02 DIAGNOSIS — Z20822 Contact with and (suspected) exposure to covid-19: Secondary | ICD-10-CM

## 2020-10-03 LAB — SARS-COV-2, NAA 2 DAY TAT

## 2020-10-03 LAB — NOVEL CORONAVIRUS, NAA: SARS-CoV-2, NAA: NOT DETECTED

## 2020-12-29 NOTE — L&D Delivery Note (Signed)
Delivery Note Labor onset: 07/28/2021  Labor Onset Time: 2300 Complete dilation at 8:18 AM  Onset of pushing at 0818 FHR second stage Cat 1 Analgesia/Anesthesia intrapartum: Epidural  Guided pushing with maternal urge. Delivery of a viable female at 73. Fetal head delivered in ROA position.  Nuchal cord: none.  Infant placed on maternal abd, dried, and tactile stim.  Cord double clamped after 1 min and cut by Leah Ford, Leah Ford.  Leah Ford present for birth.  Cord blood sample collected: Yes Arterial cord blood sample collected: N/A  Placenta delivered Leah Ford, intact, with 3 VC.  Placenta to L&D. Uterine tone firm w/ massage, TXA given, bleeding small after TXA and fundal massage  No laceration identified.  Anesthesia: N/A Repair: N/A EBL (mL): 712 Complications: none APGAR: APGAR (1 MIN): 5   APGAR (5 MINS): 8   APGAR (10 MINS):   Mom to postpartum.  Baby to Couplet care / Skin to Skin.  Leah Eastern MSN, CNM 07/29/2021, 10:27 AM

## 2021-01-29 ENCOUNTER — Encounter: Payer: 59 | Admitting: Advanced Practice Midwife

## 2021-02-11 LAB — OB RESULTS CONSOLE HEPATITIS B SURFACE ANTIGEN: Hepatitis B Surface Ag: NEGATIVE

## 2021-02-11 LAB — OB RESULTS CONSOLE RPR: RPR: NONREACTIVE

## 2021-02-11 LAB — OB RESULTS CONSOLE HIV ANTIBODY (ROUTINE TESTING): HIV: NONREACTIVE

## 2021-02-11 LAB — OB RESULTS CONSOLE RUBELLA ANTIBODY, IGM: Rubella: IMMUNE

## 2021-04-09 ENCOUNTER — Other Ambulatory Visit: Payer: Self-pay | Admitting: Obstetrics & Gynecology

## 2021-04-09 DIAGNOSIS — Z3A24 24 weeks gestation of pregnancy: Secondary | ICD-10-CM

## 2021-04-09 DIAGNOSIS — Z363 Encounter for antenatal screening for malformations: Secondary | ICD-10-CM

## 2021-04-23 ENCOUNTER — Encounter: Payer: Self-pay | Admitting: *Deleted

## 2021-04-26 ENCOUNTER — Ambulatory Visit: Payer: Medicaid Other | Attending: Obstetrics & Gynecology

## 2021-04-26 ENCOUNTER — Other Ambulatory Visit: Payer: Self-pay

## 2021-04-26 DIAGNOSIS — Z363 Encounter for antenatal screening for malformations: Secondary | ICD-10-CM | POA: Insufficient documentation

## 2021-04-26 DIAGNOSIS — Z3A24 24 weeks gestation of pregnancy: Secondary | ICD-10-CM

## 2021-04-27 DIAGNOSIS — Z3A24 24 weeks gestation of pregnancy: Secondary | ICD-10-CM

## 2021-04-27 DIAGNOSIS — O321XX0 Maternal care for breech presentation, not applicable or unspecified: Secondary | ICD-10-CM

## 2021-04-27 DIAGNOSIS — Z363 Encounter for antenatal screening for malformations: Secondary | ICD-10-CM

## 2021-06-26 ENCOUNTER — Encounter: Payer: Medicaid Other | Attending: Obstetrics and Gynecology | Admitting: Registered"

## 2021-06-26 ENCOUNTER — Other Ambulatory Visit: Payer: Self-pay

## 2021-06-26 DIAGNOSIS — O24419 Gestational diabetes mellitus in pregnancy, unspecified control: Secondary | ICD-10-CM

## 2021-06-28 ENCOUNTER — Encounter: Payer: Self-pay | Admitting: Registered"

## 2021-06-28 DIAGNOSIS — O24419 Gestational diabetes mellitus in pregnancy, unspecified control: Secondary | ICD-10-CM | POA: Insufficient documentation

## 2021-06-28 NOTE — Progress Notes (Signed)
Patient was seen on 06/26/21 for Gestational Diabetes self-management class at [facility]. The following learning objectives were met by the patient during this course:  States the definition of Gestational Diabetes States why dietary management is important in controlling blood glucose Describes the effects each nutrient has on blood glucose levels Demonstrates ability to create a balanced meal plan Demonstrates carbohydrate counting  States when to check blood glucose levels Demonstrates proper blood glucose monitoring techniques States the effect of stress and exercise on blood glucose levels States the importance of limiting caffeine and abstaining from alcohol and smoking  Blood glucose monitor given: Accu-chek Guide Me Lot #[id number] Exp: 05/03/2022 CBG: 84 mg/dL  Patient instructed to monitor glucose levels: FBS: 60 - <95; 1 hour: <140; 2 hour: <120  Patient received handouts: Nutrition Diabetes and Pregnancy, including carb counting list  Patient will be seen for follow-up as needed.

## 2021-07-20 ENCOUNTER — Encounter: Payer: Self-pay | Admitting: Nurse Practitioner

## 2021-07-20 ENCOUNTER — Telehealth: Payer: Medicaid Other | Admitting: Nurse Practitioner

## 2021-07-20 DIAGNOSIS — O2343 Unspecified infection of urinary tract in pregnancy, third trimester: Secondary | ICD-10-CM | POA: Diagnosis not present

## 2021-07-20 MED ORDER — CEPHALEXIN 500 MG PO CAPS: 500.0000 mg | ORAL_CAPSULE | Freq: Two times a day (BID) | ORAL | 0 refills | Status: DC

## 2021-07-20 NOTE — Progress Notes (Signed)
Virtual Visit Consent   Leah Ford, you are scheduled for a virtual visit with Mary-Margaret Hassell Done, [place], a [facility] provider, today.     Just as with appointments in the office, your consent must be obtained to participate.  Your consent will be active for this visit and any virtual visit you may have with one of our providers in the next 365 days.     If you have a MyChart account, a copy of this consent can be sent to you electronically.  All virtual visits are billed to your insurance company just like a traditional visit in the office.    As this is a virtual visit, video technology does not allow for your provider to perform a traditional examination.  This may limit your provider's ability to fully assess your condition.  If your provider identifies any concerns that need to be evaluated in person or the need to arrange testing (such as labs, EKG, etc.), we will make arrangements to do so.     Although advances in technology are sophisticated, we cannot ensure that it will always work on either your end or our end.  If the connection with a video visit is poor, the visit may have to be switched to a telephone visit.  With either a video or telephone visit, we are not always able to ensure that we have a secure connection.     I need to obtain your verbal consent now.   Are you willing to proceed with your visit today? YES   Leah Ford has provided verbal consent on 07/20/2021 for a virtual visit (video or telephone).   Mary-Margaret Hassell Done, FNP   Date: 07/20/2021 12:32 PM   Virtual Visit via Video Note   I, Mary-Margaret Hassell Done, connected with Leah Ford ([id number], [birth year]) on 07/20/21 at 12:30 PM EDT by a video-enabled telemedicine application and verified that I am speaking with the correct person using two identifiers.  Location: Patient: Virtual Visit Location Patient: Home Provider: Virtual Visit Location Provider: Mobile   I discussed the limitations of  evaluation and management by telemedicine and the availability of in person appointments. The patient expressed understanding and agreed to proceed.    History of Present Illness: Leah Ford is a 25 y.o. who identifies as a female who was assigned female at birth, and is being seen today for uti .  HPI: Patient is [redacted] weeks pregnant. She developed dysuria, frequency and urgency this morning. She has had UTI when she was not pregnant and felt the same. Problems:   Review of Systems  Constitutional:  Negative for fever.  Genitourinary:  Positive for dysuria, frequency and urgency.  Musculoskeletal:  Negative for back pain.   Patient Active Problem List   Diagnosis Date Noted   Gestational diabetes mellitus (GDM), antepartum 06/28/2021   Screening examination for STD (sexually transmitted disease) 12/24/2016   Irregular periods/menstrual cycles 12/24/2016   H/O sleep apnea 10/26/2012   Simple constipation 09/30/2012   Belching 09/30/2012   Epigastric abdominal pain    Gastroesophageal reflux     Allergies:  Allergies  Allergen Reactions   Gluten Meal     Celiac   Medications:  Current Outpatient Medications:    acetaminophen (TYLENOL) 500 MG tablet, Take 1,000 mg by mouth every 6 (six) hours as needed for mild pain., Disp: , Rfl:    cholecalciferol (VITAMIN D) 1000 units tablet, Take 1,000 Units by mouth daily., Disp: , Rfl:    dicyclomine (BENTYL) 20 MG  tablet, Take 1 tablet (20 mg total) by mouth 2 (two) times daily for 7 days., Disp: 14 tablet, Rfl: 0   esomeprazole (NEXIUM) 40 MG capsule, Take 1 capsule (40 mg total) by mouth daily before breakfast. (Patient not taking: Reported on 04/07/2018), Disp: 30 capsule, Rfl: 5   famotidine (PEPCID) 20 MG tablet, Take 1 tablet (20 mg total) by mouth 2 (two) times daily. (Patient not taking: Reported on 12/24/2016), Disp: 30 tablet, Rfl: 0   LORazepam (ATIVAN) 1 MG tablet, Take 1 tablet (1 mg total) by mouth every 4 (four) hours as  needed (nausea, retching attacks). (Patient not taking: Reported on 06/06/2019), Disp: 20 tablet, Rfl: 0   pantoprazole (PROTONIX) 20 MG tablet, Take 1 tablet (20 mg total) by mouth daily. (Patient not taking: Reported on 04/07/2018), Disp: 30 tablet, Rfl: 0   promethazine (PHENERGAN) 25 MG suppository, Place 1 suppository (25 mg total) rectally every 6 (six) hours as needed for nausea or vomiting. (Patient not taking: Reported on 12/24/2016), Disp: 20 suppository, Rfl: 0   propranolol (INDERAL) 20 MG tablet, Take 1 tablet (20 mg total) by mouth 2 (two) times daily as needed (panic)., Disp: 10 tablet, Rfl: 0   sucralfate (CARAFATE) 1 g tablet, Take 1 tablet (1 g total) by mouth 4 (four) times daily -  with meals and at bedtime for 7 days., Disp: 28 tablet, Rfl: 0  Observations/Objective: Patient is well-developed, well-nourished in no acute distress.  Resting comfortably at home.  Head is normocephalic, atraumatic.  No labored breathing. Speech is clear and coherent with logical content.  Patient is alert and oriented at baseline.    Assessment and Plan:  Leah Ford in today with chief complaint of No chief complaint on file.   1. UTI (urinary tract infection) during pregnancy, third trimester Take medication as prescribe Cotton underwear Take shower not bath Cranberry juice, yogurt Force fluids RTO prn  Meds ordered this encounter  Medications   cephALEXin (KEFLEX) 500 MG capsule    Sig: Take 1 capsule (500 mg total) by mouth 2 (two) times daily.    Dispense:  14 capsule    Refill:  0    Order Specific Question:   Supervising Provider    Answer:   Noemi Chapel [[id number]]        Follow Up Instructions: I discussed the assessment and treatment plan with the patient. The patient was provided an opportunity to ask questions and all were answered. The patient agreed with the plan and demonstrated an understanding of the instructions.  A copy of instructions were sent to the  patient via MyChart.  The patient was advised to call back or seek an in-person evaluation if the symptoms worsen or if the condition fails to improve as anticipated.  Time:  I spent 10 minutes with the patient via telehealth technology discussing the above problems/concerns.    Mary-Margaret Hassell Done, FNP

## 2021-07-28 ENCOUNTER — Other Ambulatory Visit: Payer: Self-pay

## 2021-07-28 ENCOUNTER — Inpatient Hospital Stay (HOSPITAL_COMMUNITY)
Admission: AD | Admit: 2021-07-28 | Discharge: 2021-07-31 | DRG: 807 | Disposition: A | Discharge status: Home / Self Care | Payer: Medicaid Other | Attending: Obstetrics & Gynecology | Admitting: Obstetrics & Gynecology

## 2021-07-28 ENCOUNTER — Encounter (HOSPITAL_COMMUNITY): Payer: Self-pay | Admitting: Obstetrics and Gynecology

## 2021-07-28 DIAGNOSIS — O24420 Gestational diabetes mellitus in childbirth, diet controlled: Principal | ICD-10-CM | POA: Diagnosis present

## 2021-07-28 DIAGNOSIS — K900 Celiac disease: Secondary | ICD-10-CM | POA: Diagnosis present

## 2021-07-28 DIAGNOSIS — Z20822 Contact with and (suspected) exposure to covid-19: Secondary | ICD-10-CM | POA: Diagnosis present

## 2021-07-28 DIAGNOSIS — Z3A37 37 weeks gestation of pregnancy: Secondary | ICD-10-CM

## 2021-07-28 DIAGNOSIS — O24419 Gestational diabetes mellitus in pregnancy, unspecified control: Secondary | ICD-10-CM | POA: Diagnosis present

## 2021-07-28 DIAGNOSIS — O9962 Diseases of the digestive system complicating childbirth: Secondary | ICD-10-CM | POA: Diagnosis present

## 2021-07-28 DIAGNOSIS — F419 Anxiety disorder, unspecified: Secondary | ICD-10-CM | POA: Diagnosis present

## 2021-07-28 DIAGNOSIS — F32A Depression, unspecified: Secondary | ICD-10-CM | POA: Diagnosis present

## 2021-07-28 DIAGNOSIS — O99344 Other mental disorders complicating childbirth: Secondary | ICD-10-CM | POA: Diagnosis present

## 2021-07-28 DIAGNOSIS — O24410 Gestational diabetes mellitus in pregnancy, diet controlled: Secondary | ICD-10-CM

## 2021-07-28 DIAGNOSIS — Z23 Encounter for immunization: Secondary | ICD-10-CM

## 2021-07-29 ENCOUNTER — Inpatient Hospital Stay (HOSPITAL_COMMUNITY): Payer: Medicaid Other | Admitting: Anesthesiology

## 2021-07-29 ENCOUNTER — Encounter (HOSPITAL_COMMUNITY): Payer: Self-pay | Admitting: Obstetrics and Gynecology

## 2021-07-29 DIAGNOSIS — Z23 Encounter for immunization: Secondary | ICD-10-CM | POA: Diagnosis not present

## 2021-07-29 DIAGNOSIS — F32A Depression, unspecified: Secondary | ICD-10-CM | POA: Diagnosis present

## 2021-07-29 DIAGNOSIS — Z20822 Contact with and (suspected) exposure to covid-19: Secondary | ICD-10-CM | POA: Diagnosis present

## 2021-07-29 DIAGNOSIS — Z3A37 37 weeks gestation of pregnancy: Secondary | ICD-10-CM | POA: Diagnosis not present

## 2021-07-29 DIAGNOSIS — K900 Celiac disease: Secondary | ICD-10-CM | POA: Diagnosis present

## 2021-07-29 DIAGNOSIS — O9962 Diseases of the digestive system complicating childbirth: Secondary | ICD-10-CM | POA: Diagnosis present

## 2021-07-29 DIAGNOSIS — F419 Anxiety disorder, unspecified: Secondary | ICD-10-CM | POA: Diagnosis present

## 2021-07-29 DIAGNOSIS — O24420 Gestational diabetes mellitus in childbirth, diet controlled: Secondary | ICD-10-CM | POA: Diagnosis present

## 2021-07-29 DIAGNOSIS — O26893 Other specified pregnancy related conditions, third trimester: Secondary | ICD-10-CM | POA: Diagnosis present

## 2021-07-29 DIAGNOSIS — O99344 Other mental disorders complicating childbirth: Secondary | ICD-10-CM | POA: Diagnosis present

## 2021-07-29 LAB — RAPID HIV SCREEN (HIV 1/2 AB+AG)
HIV 1/2 Antibodies: NONREACTIVE
HIV-1 P24 Antigen - HIV24: NONREACTIVE

## 2021-07-29 LAB — RESP PANEL BY RT-PCR (FLU A&B, COVID) ARPGX2
Influenza A by PCR: NEGATIVE
Influenza B by PCR: NEGATIVE
SARS Coronavirus 2 by RT PCR: NEGATIVE

## 2021-07-29 LAB — TYPE AND SCREEN
ABO/RH(D): A POS
Antibody Screen: NEGATIVE

## 2021-07-29 LAB — CBC
HCT: 39.4 % (ref 36.0–46.0)
Hemoglobin: 14.2 g/dL (ref 12.0–15.0)
MCH: 34.4 pg — ABNORMAL HIGH (ref 26.0–34.0)
MCHC: 36 g/dL (ref 30.0–36.0)
MCV: 95.4 fL (ref 80.0–100.0)
Platelets: 175 10*3/uL (ref 150–400)
RBC: 4.13 MIL/uL (ref 3.87–5.11)
RDW: 13.1 % (ref 11.5–15.5)
WBC: 7.6 10*3/uL (ref 4.0–10.5)
nRBC: 0 % (ref 0.0–0.2)

## 2021-07-29 LAB — GROUP B STREP BY PCR: Group B strep by PCR: NEGATIVE

## 2021-07-29 LAB — GLUCOSE, CAPILLARY
Glucose-Capillary: 78 mg/dL (ref 70–99)
Glucose-Capillary: 68 mg/dL — ABNORMAL LOW (ref 70–99)
Glucose-Capillary: 88 mg/dL (ref 70–99)

## 2021-07-29 LAB — RPR: RPR Ser Ql: NONREACTIVE

## 2021-07-29 MED ORDER — LIDOCAINE HCL (PF) 1 % IJ SOLN
INTRAMUSCULAR | Status: DC | PRN
  Administered 2021-07-29: 7 mL via EPIDURAL
  Administered 2021-07-29: 3 mL via EPIDURAL

## 2021-07-29 MED ORDER — ACETAMINOPHEN 325 MG PO TABS: 650.0000 mg | ORAL_TABLET | ORAL | Status: DC | PRN

## 2021-07-29 MED ORDER — ONDANSETRON HCL 4 MG/2ML IJ SOLN: 4.0000 mg | Freq: Four times a day (QID) | INTRAMUSCULAR | Status: DC | PRN

## 2021-07-29 MED ORDER — EPHEDRINE 5 MG/ML INJ: 10.0000 mg | INTRAVENOUS | Status: DC | PRN

## 2021-07-29 MED ORDER — COCONUT OIL OIL
1.0000 "application " | TOPICAL_OIL | Status: DC | PRN
  Administered 2021-07-30: 1 via TOPICAL

## 2021-07-29 MED ORDER — SIMETHICONE 80 MG PO CHEW: 80.0000 mg | CHEWABLE_TABLET | ORAL | Status: DC | PRN

## 2021-07-29 MED ORDER — LIDOCAINE HCL (PF) 1 % IJ SOLN: 30.0000 mL | INTRAMUSCULAR | Status: DC | PRN

## 2021-07-29 MED ORDER — OXYCODONE-ACETAMINOPHEN 5-325 MG PO TABS: 2.0000 | ORAL_TABLET | ORAL | Status: DC | PRN

## 2021-07-29 MED ORDER — LACTATED RINGERS IV SOLN: 500.0000 mL | INTRAVENOUS | Status: DC | PRN

## 2021-07-29 MED ORDER — PHENYLEPHRINE 40 MCG/ML (10ML) SYRINGE FOR IV PUSH (FOR BLOOD PRESSURE SUPPORT): 80.0000 ug | PREFILLED_SYRINGE | INTRAVENOUS | Status: DC | PRN

## 2021-07-29 MED ORDER — IBUPROFEN 600 MG PO TABS
600.0000 mg | ORAL_TABLET | Freq: Four times a day (QID) | ORAL | Status: DC
  Administered 2021-07-31: 600 mg via ORAL
  Filled 2021-07-29 (×2): qty 1

## 2021-07-29 MED ORDER — OXYTOCIN-SODIUM CHLORIDE 30-0.9 UT/500ML-% IV SOLN
1.0000 m[IU]/min | INTRAVENOUS | Status: DC
  Administered 2021-07-29: 2 m[IU]/min via INTRAVENOUS
  Administered 2021-07-29: 4 m[IU]/min via INTRAVENOUS
  Filled 2021-07-29: qty 500

## 2021-07-29 MED ORDER — FLEET ENEMA 7-19 GM/118ML RE ENEM: 1.0000 | ENEMA | RECTAL | Status: DC | PRN

## 2021-07-29 MED ORDER — OXYTOCIN BOLUS FROM INFUSION: 333.0000 mL | Freq: Once | INTRAVENOUS | Status: DC

## 2021-07-29 MED ORDER — ONDANSETRON HCL 4 MG PO TABS: 4.0000 mg | ORAL_TABLET | ORAL | Status: DC | PRN

## 2021-07-29 MED ORDER — TRANEXAMIC ACID-NACL 1000-0.7 MG/100ML-% IV SOLN
INTRAVENOUS | Status: AC
  Administered 2021-07-29: 1000 mg
  Filled 2021-07-29: qty 100

## 2021-07-29 MED ORDER — FENTANYL-BUPIVACAINE-NACL 0.5-0.125-0.9 MG/250ML-% EP SOLN
12.0000 mL/h | EPIDURAL | Status: DC | PRN
  Administered 2021-07-29: 12 mL/h via EPIDURAL
  Filled 2021-07-29: qty 250

## 2021-07-29 MED ORDER — TRANEXAMIC ACID-NACL 1000-0.7 MG/100ML-% IV SOLN: 1000.0000 mg | INTRAVENOUS | Status: DC

## 2021-07-29 MED ORDER — SOD CITRATE-CITRIC ACID 500-334 MG/5ML PO SOLN: 30.0000 mL | ORAL | Status: DC | PRN

## 2021-07-29 MED ORDER — SODIUM CHLORIDE 0.9 % IV SOLN: 5.0000 10*6.[IU] | Freq: Once | INTRAVENOUS | Status: DC

## 2021-07-29 MED ORDER — DIBUCAINE (PERIANAL) 1 % EX OINT: 1.0000 "application " | TOPICAL_OINTMENT | CUTANEOUS | Status: DC | PRN

## 2021-07-29 MED ORDER — FENTANYL CITRATE (PF) 100 MCG/2ML IJ SOLN: 50.0000 ug | INTRAMUSCULAR | Status: DC | PRN

## 2021-07-29 MED ORDER — OXYCODONE-ACETAMINOPHEN 5-325 MG PO TABS
2.0000 | ORAL_TABLET | ORAL | Status: DC | PRN
Start: 2021-07-29 — End: 2021-07-29

## 2021-07-29 MED ORDER — TETANUS-DIPHTH-ACELL PERTUSSIS 5-2.5-18.5 LF-MCG/0.5 IM SUSY
0.5000 mL | PREFILLED_SYRINGE | Freq: Once | INTRAMUSCULAR | Status: AC
  Administered 2021-07-30: 0.5 mL via INTRAMUSCULAR
  Filled 2021-07-29: qty 0.5

## 2021-07-29 MED ORDER — BENZOCAINE-MENTHOL 20-0.5 % EX AERO
1.0000 "application " | INHALATION_SPRAY | CUTANEOUS | Status: DC | PRN
  Administered 2021-07-29: 1 via TOPICAL
  Filled 2021-07-29: qty 56

## 2021-07-29 MED ORDER — SOD CITRATE-CITRIC ACID 500-334 MG/5ML PO SOLN
30.0000 mL | ORAL | Status: DC | PRN
  Administered 2021-07-29: 30 mL via ORAL
  Filled 2021-07-29: qty 30

## 2021-07-29 MED ORDER — OXYCODONE-ACETAMINOPHEN 5-325 MG PO TABS: 1.0000 | ORAL_TABLET | ORAL | Status: DC | PRN

## 2021-07-29 MED ORDER — OXYTOCIN BOLUS FROM INFUSION
333.0000 mL | Freq: Once | INTRAVENOUS | Status: AC
  Administered 2021-07-29: 333 mL via INTRAVENOUS

## 2021-07-29 MED ORDER — WITCH HAZEL-GLYCERIN EX PADS
1.0000 "application " | MEDICATED_PAD | CUTANEOUS | Status: DC | PRN
  Administered 2021-07-30: 1 via TOPICAL

## 2021-07-29 MED ORDER — LACTATED RINGERS IV SOLN: 500.0000 mL | Freq: Once | INTRAVENOUS | Status: DC

## 2021-07-29 MED ORDER — PENICILLIN G POT IN DEXTROSE 60000 UNIT/ML IV SOLN: 3.0000 10*6.[IU] | INTRAVENOUS | Status: DC

## 2021-07-29 MED ORDER — OXYTOCIN-SODIUM CHLORIDE 30-0.9 UT/500ML-% IV SOLN: 2.5000 [IU]/h | INTRAVENOUS | Status: DC

## 2021-07-29 MED ORDER — ACETAMINOPHEN 325 MG PO TABS
650.0000 mg | ORAL_TABLET | ORAL | Status: DC | PRN
  Administered 2021-07-29 – 2021-07-31 (×7): 650 mg via ORAL
  Filled 2021-07-29 (×6): qty 2

## 2021-07-29 MED ORDER — LACTATED RINGERS IV SOLN: INTRAVENOUS | Status: DC

## 2021-07-29 MED ORDER — DIPHENHYDRAMINE HCL 25 MG PO CAPS: 25.0000 mg | ORAL_CAPSULE | Freq: Four times a day (QID) | ORAL | Status: DC | PRN

## 2021-07-29 MED ORDER — DIPHENHYDRAMINE HCL 50 MG/ML IJ SOLN
12.5000 mg | INTRAMUSCULAR | Status: DC | PRN
Start: 2021-07-29 — End: 2021-07-29

## 2021-07-29 MED ORDER — PRENATAL MULTIVITAMIN CH
1.0000 | ORAL_TABLET | Freq: Every day | ORAL | Status: DC
  Administered 2021-07-29 – 2021-07-31 (×3): 1 via ORAL
  Filled 2021-07-29 (×3): qty 1

## 2021-07-29 MED ORDER — TERBUTALINE SULFATE 1 MG/ML IJ SOLN: 0.2500 mg | Freq: Once | INTRAMUSCULAR | Status: DC | PRN

## 2021-07-29 MED ORDER — SENNOSIDES-DOCUSATE SODIUM 8.6-50 MG PO TABS
2.0000 | ORAL_TABLET | ORAL | Status: DC
  Administered 2021-07-29 – 2021-07-31 (×3): 2 via ORAL
  Filled 2021-07-29 (×3): qty 2

## 2021-07-29 MED ORDER — ONDANSETRON HCL 4 MG/2ML IJ SOLN: 4.0000 mg | INTRAMUSCULAR | Status: DC | PRN

## 2021-07-29 NOTE — Lactation Note (Signed)
This note was copied from a baby's chart. Lactation Consultation Note  Patient Name: Leah Ford [id number]'X Date: 07/29/2021 Reason for consult: Initial assessment;Mother's request;Primapara;1st time breastfeeding;Early term 37-38.6wks;Infant < 6lbs Age:24 hours  Infant struggling with latch. Mom flat inverted nipples. [place] worked with tea cup hold sustain for few sucks but pops on and off.   LC went over behavior of LPTI to reduce calorie loss.   Mom breast and bottle feed formula.  Plan 1. To feed based in cues 8-12x in24 hr period no more than 3 hrs without an attempt. Mom to offer breast first.  2. Any EBM mom to offer via spoon or cup feeder. Mom pace bottle feed with formula LPTI supplementation guide provided.  3. Mom to pump with DEBP q 3hrs for 15 min.  4. I and O sheet reviewed.  5. [place] brochure of inpatient and outpatient services reviewed.  All questions answered at the end of the visit.   Maternal Data Has patient been taught Hand Expression?: Yes Does the patient have breastfeeding experience prior to this delivery?: No  Feeding Mother's Current Feeding Choice: Breast Milk and Formula  LATCH Score Latch: Repeated attempts needed to sustain latch, nipple held in mouth throughout feeding, stimulation needed to elicit sucking reflex.  Audible Swallowing: A few with stimulation  Type of Nipple: Flat  Comfort (Breast/Nipple): Soft / non-tender  Hold (Positioning): Assistance needed to correctly position infant at breast and maintain latch.  LATCH Score: 6   Lactation Tools Discussed/Used Tools: Pump;Flanges Flange Size: 24 Breast pump type: Double-Electric Breast Pump Pump Education: Setup, frequency, and cleaning;Milk Storage Reason for Pumping: increase stimulation Pumping frequency: every 3 hrs for 15 min  Interventions Interventions: Breast feeding basics reviewed;Support pillows;Education;Assisted with latch;Position options;Skin to skin;Expressed  milk;Breast massage;Hand express;DEBP;Breast compression;Adjust position;Pre-pump if needed  Discharge Pump: Personal;Manual WIC Program: Yes  Consult Status Consult Status: Follow-up Date: 07/30/21 Follow-up type: In-patient    Kayn Haymore  N[place] 07/29/2021, 4:46 PM

## 2021-07-29 NOTE — Lactation Note (Signed)
This note was copied from a baby's chart. Lactation Consultation Note  Patient Name: Leah Ford JSEGB'[id number] Date: 07/29/2021 Reason for consult: L&D Initial assessment;Primapara;1st time breastfeeding;Early term 37-38.6wks;Other (Comment) (LC -  L/D visit less than 60 mins/ LC called  LD RN to ask mom/ desired LC for L/D. Baby STS on moms chest/ areolo edema noted / reverse pressure soften the areola ( LT breast) baby latched w/ assist for 12 mins / few swallows. Br shells / HP indicated.) ( LD RN aware to mention in report to the RN .  Age:LC arrived less than 60 mins PP.  After feeding 12 mins on the left breast / LC assisted to attempt to feed on the right ( areola more swollen. Baby latched for few sucks and released. Baby STS with mom.  Mom aware she will [facility].  LC called report to [place] LC.   Maternal Data Has patient been taught Hand Expression?:  (briefly showed mom prior to latch and afterwards to soften the tissue) Does the patient have breastfeeding experience prior to this delivery?: No  Feeding Mother's Current Feeding Choice: Breast Milk and Formula  LATCH Score Latch: Grasps breast easily, tongue down, lips flanged, rhythmical sucking.  Audible Swallowing: A few with stimulation  Type of Nipple: Everted at rest and after stimulation  Comfort (Breast/Nipple): Soft / non-tender  Hold (Positioning): Assistance needed to correctly position infant at breast and maintain latch.  LATCH Score: 8   Lactation Tools Discussed/Used    Interventions Interventions: Breast feeding basics reviewed;Assisted with latch;Skin to skin;Breast massage;Reverse pressure;Breast compression;Adjust position;Support pillows;Education  Discharge    Consult Status Consult Status: Follow-up (from LD) Date: 07/29/21 Follow-up type: In-patient    [place] 07/29/2021, 11:54 AM

## 2021-07-29 NOTE — H&P (Signed)
OB ADMISSION/ HISTORY & PHYSICAL:  Admission Date: 07/28/2021 11:15 PM  Admit Diagnosis: Normal labor  Leah Ford is a 25 y.o. female G1P0000 27w3dpresenting for SROM clear fluid @ 1100pm. Endorses active FM, denies vaginal bleeding. Irregular ctxs.  History of current pregnancy: G1P0000   Primary OB Provider: [facility] Patient entered care with [facility] at 13.3 wks.   EDC 08/16/21 by unsure LMP 11/02/20 and congruent w/ 13.3 wk U/S.   Anatomy scan:  24.4 wks, complete w/ anterior placenta.   Antenatal testing: for growth d/t celiac dx started at 30 weeks Last evaluation: 36.6. U/S with MFM @ 35.4  wks showed EFW 5+12, 37% Significant prenatal events:  Celiac Dx Patient Active Problem List   Diagnosis Date Noted   Normal labor 07/29/2021   Normal labor and delivery 07/29/2021   Gestational diabetes mellitus (GDM), antepartum 06/28/2021   Screening examination for STD (sexually transmitted disease) 12/24/2016   Irregular periods/menstrual cycles 12/24/2016   H/O sleep apnea 10/26/2012   Simple constipation 09/30/2012   Belching 09/30/2012   Epigastric abdominal pain    Gastroesophageal reflux     Prenatal Labs: ABO, Rh: --/--/A POS (07/31 2352) Antibody: NEG (07/31 2352) Rubella:   Immune RPR:   NR HBsAg:   Negative HIV: NON REACTIVE (07/31 2352)  GTT: 163, failed 3 hour gtt GBS: NEGATIVE/-- (08/01 0106)  GC/CHL: Negative/Negative  Genetics: Panorama low risk, Horizon negative Tdap/influenza vaccines: Declined/Declined   OB History  Gravida Para Term Preterm AB Living  1 0 0 0 0 0  SAB IAB Ectopic Multiple Live Births  0 0 0 0 0    # Outcome Date GA Lbr Len/2nd Weight Sex Delivery Anes PTL Lv  1 Current             Medical / Surgical History: Past medical history:  Past Medical History:  Diagnosis Date   Abdominal pain, chronic, epigastric    Celiac disease    Gastritis    Gastroesophageal reflux     Past surgical history: History reviewed. No pertinent  surgical history. Family History:  Family History  Problem Relation Age of Onset   Diabetes Sister    Diabetes Sister    Cholelithiasis Mother    Diabetes Mother    Hypertension Mother    Hyperlipidemia Mother    GER disease Father    Cholelithiasis Father    Hyperlipidemia Father    Cancer Paternal Grandmother        unsure which cancer per pt    Social History:  reports that she has never smoked. She has never used smokeless tobacco. She reports that she does not drink alcohol and does not use drugs.  Allergies: Gluten meal   Current Medications at time of admission:  Prior to Admission medications   Medication Sig Start Date End Date Taking? Authorizing Provider  Prenatal Vit-Fe Fumarate-FA (PRENATAL MULTIVITAMIN) TABS tablet Take 1 tablet by mouth daily at 12 noon.   Yes [provider]  acetaminophen (TYLENOL) 500 MG tablet Take 1,000 mg by mouth every 6 (six) hours as needed for mild pain.    [provider]  cephALEXin (KEFLEX) 500 MG capsule Take 1 capsule (500 mg total) by mouth 2 (two) times daily. 07/20/21   MHassell DoneMary-Margaret, FNP  cholecalciferol (VITAMIN D) 1000 units tablet Take 1,000 Units by mouth daily.    [provider]  dicyclomine (BENTYL) 20 MG tablet Take 1 tablet (20 mg total) by mouth 2 (two) times daily for 7  days. 06/06/19 06/13/19  Janeece Fitting, PA-C  esomeprazole (NEXIUM) 40 MG capsule Take 1 capsule (40 mg total) by mouth daily before breakfast. Patient not taking: Reported on 04/07/2018 09/26/13 09/26/14  Oletha Blend, MD  famotidine (PEPCID) 20 MG tablet Take 1 tablet (20 mg total) by mouth 2 (two) times daily. Patient not taking: Reported on 12/24/2016 10/13/14   Piepenbrink, Anderson Malta, PA-C  LORazepam (ATIVAN) 1 MG tablet Take 1 tablet (1 mg total) by mouth every 4 (four) hours as needed (nausea, retching attacks). Patient not taking: Reported on 06/06/2019 09/21/15   Margarita Mail, PA-C  pantoprazole (PROTONIX) 20 MG  tablet Take 1 tablet (20 mg total) by mouth daily. Patient not taking: Reported on 04/07/2018 09/07/17   Horton, Barbette Hair, MD  promethazine (PHENERGAN) 25 MG suppository Place 1 suppository (25 mg total) rectally every 6 (six) hours as needed for nausea or vomiting. Patient not taking: Reported on 12/24/2016 09/21/15   Margarita Mail, PA-C  propranolol (INDERAL) 20 MG tablet Take 1 tablet (20 mg total) by mouth 2 (two) times daily as needed (panic). 04/18/16   Leo Grosser, MD  sucralfate (CARAFATE) 1 g tablet Take 1 tablet (1 g total) by mouth 4 (four) times daily -  with meals and at bedtime for 7 days. 04/07/18 04/14/18  Albesa Seen, PA-C    Review of Systems: Constitutional: Negative   HENT: Negative   Eyes: Negative   Respiratory: Negative   Cardiovascular: Negative   Gastrointestinal: Negative  Genitourinary: negative for bloody show, positive for LOF   Musculoskeletal: Negative   Skin: Negative   Neurological: Negative   Endo/Heme/Allergies: Negative   Psychiatric/Behavioral: Negative    Physical Exam: VS: Blood pressure 100/65, pulse 62, temperature 98.6 F (37 C), temperature source Oral, SpO2 98 %. AAO x3, no signs of distress Cardiovascular: RRR Respiratory: Lung fields clear to ausculation GU/GI: Abdomen gravid, non-tender, non-distended, active FM, vertex, EFW 6lbs per Leopold's Extremities: negative edema, negative for pain, tenderness, and cords  Cervical exam:Dilation: 2.5 Effacement (%): 40 Station: -2 Exam by:: Bria Torrence RN FHR: baseline rate 140 / variability moderate / accelerations present / absent decelerations TOCO: 2-4   Prenatal Transfer Tool  Maternal Diabetes: Yes:  Diabetes Type:  Diet controlled Genetic Screening: Normal Maternal Ultrasounds/Referrals: Normal Fetal Ultrasounds or other Referrals:  None Maternal Substance Abuse:  No Significant Maternal Medications:  None Significant Maternal Lab Results: Group B Strep  negative    Assessment: 25 y.o. G1P0000 66w3dadmitted for SROM, clear fluid @ 11pm.   Latent stage of labor FHR category 1 GBS Negative Pain management plan: epidural   Plan:  Admit to L&D Routine admission orders Epidural PRN CBGs Will notified Dr RMancel Baleof admission and plan of care  KDomingo PulseMSN, CNM 07/29/2021 5:58 AM

## 2021-07-29 NOTE — Progress Notes (Addendum)
Lastacia Solum is a 25 y.o. G1P0000 at 72w3dadmitted for rupture of membranes  Subjective:  Resting comfortably with epidural. On peanut ball. Objective: BP 111/79   Pulse 77   Temp 98.6 F (37 C) (Oral)   SpO2 98%  No intake/output data recorded. No intake/output data recorded.  FHT:  FHR: 120 bpm, variability: moderate,  accelerations:  Present,  decelerations:  Present Early UC:   regular, every 2-4 minutes SVE:   6/80/0  Labs: Lab Results  Component Value Date   WBC 7.6 07/28/2021   HGB 14.2 07/28/2021   HCT 39.4 07/28/2021   MCV 95.4 07/28/2021   PLT 175 07/28/2021    Assessment / Plan: Augmentation of labor, progressing well. On pitocin.  Labor: Progressing normally Fetal Wellbeing:  Category I Pain Control:  Epidural I/D:   GBS negative Anticipated MOD:  NSVD Dr RMancel Balenotified of pt status.  KBeecherMSN, CNM 07/29/2021, 6:13 AM

## 2021-07-29 NOTE — Anesthesia Preprocedure Evaluation (Signed)
Anesthesia Evaluation  Patient identified by MRN, date of birth, ID band Patient awake    Reviewed: Allergy & Precautions, H&P , NPO status , Patient's Chart, lab work & pertinent test results  History of Anesthesia Complications Negative for: history of anesthetic complications  Airway Mallampati: II  TM Distance: >3 FB     Dental   Pulmonary sleep apnea ,    Pulmonary exam normal        Cardiovascular negative cardio ROS   Rhythm:regular Rate:Normal     Neuro/Psych negative neurological ROS  negative psych ROS   GI/Hepatic Neg liver ROS, GERD  ,Celiac disease   Endo/Other  diabetes, Gestational  Renal/GU negative Renal ROS  negative genitourinary   Musculoskeletal   Abdominal   Peds  Hematology negative hematology ROS (+)   Anesthesia Other Findings   Reproductive/Obstetrics (+) Pregnancy                            Anesthesia Physical Anesthesia Plan  ASA: 2  Anesthesia Plan: Epidural   Post-op Pain Management:    Induction:   PONV Risk Score and Plan:   Airway Management Planned:   Additional Equipment:   Intra-op Plan:   Post-operative Plan:   Informed Consent: I have reviewed the patients History and Physical, chart, labs and discussed the procedure including the risks, benefits and alternatives for the proposed anesthesia with the patient or authorized representative who has indicated his/her understanding and acceptance.       Plan Discussed with:   Anesthesia Plan Comments:         Anesthesia Quick Evaluation

## 2021-07-29 NOTE — Anesthesia Procedure Notes (Signed)
Epidural Patient location during procedure: OB Start time: 07/29/2021 2:33 AM End time: 07/29/2021 2:45 AM  Staffing Anesthesiologist: Lidia Collum, MD Performed: anesthesiologist   Preanesthetic Checklist Completed: patient identified, IV checked, risks and benefits discussed, monitors and equipment checked, pre-op evaluation and timeout performed  Epidural Patient position: sitting Prep: DuraPrep Patient monitoring: heart rate, continuous pulse ox and blood pressure Approach: midline Location: L3-L4 Injection technique: LOR air  Needle:  Needle type: Tuohy  Needle gauge: 17 G Needle length: 9 cm Needle insertion depth: 5 cm Catheter type: closed end flexible Catheter size: 19 Gauge Catheter at skin depth: 10 cm Test dose: negative  Assessment Events: blood not aspirated, injection not painful, no injection resistance, no paresthesia and negative IV test  Additional Notes Reason for block:procedure for pain

## 2021-07-29 NOTE — Anesthesia Postprocedure Evaluation (Signed)
Anesthesia Post Note  Patient: Jessabelle Brackin  Procedure(s) Performed: AN AD HOC LABOR EPIDURAL     Patient location during evaluation: Mother Baby Anesthesia Type: Epidural Level of consciousness: awake and alert Pain management: pain level controlled Vital Signs Assessment: post-procedure vital signs reviewed and stable Respiratory status: spontaneous breathing, nonlabored ventilation and respiratory function stable Cardiovascular status: stable Postop Assessment: no headache, no backache and epidural receding Anesthetic complications: no   No notable events documented.  Last Vitals:  Vitals:   07/29/21 1148 07/29/21 1250  BP: 120/85 117/85  Pulse: 72 76  Resp:  16  Temp: 36.7 C 37.1 C  SpO2: 99% 98%    Last Pain:  Vitals:   07/29/21 1250  TempSrc: Oral  PainSc:    Pain Goal:                   Caleigha Zale

## 2021-07-30 LAB — GLUCOSE, CAPILLARY: Glucose-Capillary: 74 mg/dL (ref 70–99)

## 2021-07-30 LAB — CBC
HCT: 34.9 % — ABNORMAL LOW (ref 36.0–46.0)
Hemoglobin: 12.3 g/dL (ref 12.0–15.0)
MCH: 34.1 pg — ABNORMAL HIGH (ref 26.0–34.0)
MCHC: 35.2 g/dL (ref 30.0–36.0)
MCV: 96.7 fL (ref 80.0–100.0)
Platelets: 150 10*3/uL (ref 150–400)
RBC: 3.61 MIL/uL — ABNORMAL LOW (ref 3.87–5.11)
RDW: 13.6 % (ref 11.5–15.5)
WBC: 10 10*3/uL (ref 4.0–10.5)
nRBC: 0 % (ref 0.0–0.2)

## 2021-07-30 MED ORDER — OXYCODONE HCL 5 MG PO TABS
5.0000 mg | ORAL_TABLET | ORAL | Status: DC | PRN
Start: 2021-07-30 — End: 2021-07-31
  Administered 2021-07-30 (×4): 5 mg via ORAL
  Filled 2021-07-30 (×4): qty 1

## 2021-07-30 NOTE — Progress Notes (Addendum)
Subjective: Postpartum Day # 1 : S/P NSVD due to pt was admitted on 8/1 at 37.3 weeks in early labor at 2.5cm dilated with SROM, h/o GDMA1 no meds, FBS 74 today, h/o celiac, pt progressed to SVD on 8/1 @ 1002 with pitocin over perineum and vaginal abrasions, ebl 412ms, hgb drop of 14.2 to 12.3. Baby Female no circ desired. H/O depression and anxiety, atarax during pregnancy, mood stable, denies SI/HI. Patient up ad lib, denies syncope or dizziness. Reports consuming regular diet without issues and denies N/V. Patient reports 0 bowel movement + passing flatus.  Denies issues with urination and reports bleeding is "lighter."  Patient is breastfeeding and reports using nipple shields, having issues, and supplementing with bottle.  Desires undecided for postpartum contraception.  Pain is being appropriately managed with use of po meds.   Perineum and vaginal abrasion Feeding:  breast Contraceptive plan:  undecided BB: No circ desired.  Objective: Vital signs in last 24 hours: Patient Vitals for the past 24 hrs:  BP Temp Temp src Pulse Resp SpO2  07/30/21 0553 109/80 98.3 F (36.8 C) -- 76 18 97 %  07/29/21 2100 112/83 98.6 F (37 C) Oral 74 18 --  07/29/21 1700 118/82 98.6 F (37 C) Oral 66 17 98 %  07/29/21 1250 117/85 98.7 F (37.1 C) Oral 76 16 98 %  07/29/21 1148 120/85 98 F (36.7 C) Oral 72 -- 99 %  07/29/21 1130 114/69 -- -- 76 18 --  07/29/21 1115 106/87 -- -- 81 -- --  07/29/21 1100 122/83 -- -- 81 16 --  07/29/21 1046 119/80 -- -- 78 -- --  07/29/21 1030 118/77 -- -- 82 -- --  07/29/21 1023 113/79 -- -- 86 18 --     Physical Exam:  General: alert, cooperative, and appears stated age Mood/Affect: happy Lungs: clear to auscultation, no wheezes, rales or rhonchi, symmetric air entry.  Heart: normal rate, regular rhythm, normal S1, S2, no murmurs, rubs, clicks or gallops. Breast: breasts appear normal, no suspicious masses, no skin or nipple changes or axillary nodes. Abdomen:  +  bowel sounds, soft, non-tender GU: perineum approximate, healing well. No signs of external hematomas.  Uterine Fundus: firm Lochia: appropriate Skin: Warm, Dry. DVT Evaluation: No evidence of DVT seen on physical exam. Negative Homan's sign. No cords or calf tenderness. No significant calf/ankle edema.  CBC Latest Ref Rng & Units 07/30/2021 07/28/2021 06/06/2019  WBC 4.0 - 10.5 K/uL 10.0 7.6 7.2  Hemoglobin 12.0 - 15.0 g/dL 12.3 14.2 14.3  Hematocrit 36.0 - 46.0 % 34.9(L) 39.4 40.9  Platelets 150 - 400 K/uL 150 175 295    Results for orders placed or performed during the hospital encounter of 07/28/21 (from the past 24 hour(s))  CBC     Status: Abnormal   Collection Time: 07/30/21  6:34 AM  Result Value Ref Range   WBC 10.0 4.0 - 10.5 K/uL   RBC 3.61 (L) 3.87 - 5.11 MIL/uL   Hemoglobin 12.3 12.0 - 15.0 g/dL   HCT 34.9 (L) 36.0 - 46.0 %   MCV 96.7 80.0 - 100.0 fL   MCH 34.1 (H) 26.0 - 34.0 pg   MCHC 35.2 30.0 - 36.0 g/dL   RDW 13.6 11.5 - 15.5 %   Platelets 150 150 - 400 K/uL   nRBC 0.0 0.0 - 0.2 %  Glucose, capillary     Status: None   Collection Time: 07/30/21  7:02 AM  Result Value Ref Range  Glucose-Capillary 74 70 - 99 mg/dL     CBG (last 3)  Recent Labs    07/29/21 0640 07/29/21 0856 07/30/21 0702  GLUCAP 68* 88 74     I/O last 3 completed shifts: In: -  Out: 1425 [Urine:1025; Blood:400]   Assessment Postpartum Day # 1 : S/P NSVD due to pt was admitted on 8/1 at 37.3 weeks in early labor at 2.5cm dilated with SROM, h/o GDMA1 no meds, FBS 74 today, h/o celiac, pt progressed to SVD on 8/1 @ 1002 with pitocin over perineum and vaginal abrasions, ebl 411ms, hgb drop of 14.2 to 12.3. Baby Female no circ desired. H/O depression and anxiety, atarax during pregnancy, mood stable, denies SI/HI. Pt stable. -1 involution. breastfeeding. Hemodynamically stable.   Plan: Continue other mgmt as ordered GDMA1: F/U 6 weeks PP with 2HGTT VTE prophylactics: Early ambulated as  tolerates.  Pain control: Motrin/Tylenol PRN Education given regarding options for contraception, including barrier methods, injectable contraception, IUD placement, oral contraceptives.  Plan for discharge tomorrow and Breastfeeding  Dr. DCharlesetta Garibaldito be updated on patient status  JEamc - LanierNP-C, CNM 07/30/2021, 9:56 AM

## 2021-07-30 NOTE — Social Work (Addendum)
MOB was referred for history of anxiety.  * Referral screened out by Clinical Social Worker because none of the following criteria appear to apply: ~ History of anxiety during this pregnancy, or of post-partum depression following prior delivery.  ~ Diagnosis of anxiety and/or depression within last 3 years. Per chart it is noted MOB diagnosed in 2015.  OR * MOB's symptoms currently being treated with medication and/or therapy. Per chart review, it is noted MOB takes Hydroxyzine 50 mg for anxiety.   Please contact the Clinical Social Worker if needs arise, by [facility] request, or if MOB scores greater than 9/yes to question 10 on Edinburgh Postpartum Depression Screen.   Kathrin Greathouse, MSW, LCSW [facility] [place] Worker  [phone] 07/30/2021  9:31 AM

## 2021-07-31 ENCOUNTER — Ambulatory Visit: Payer: Self-pay

## 2021-07-31 MED ORDER — DIBUCAINE (PERIANAL) 1 % EX OINT: 1.0000 "application " | TOPICAL_OINTMENT | CUTANEOUS | 1 refills | Status: DC | PRN

## 2021-07-31 MED ORDER — ACETAMINOPHEN 325 MG PO TABS: 650.0000 mg | ORAL_TABLET | ORAL | Status: DC | PRN

## 2021-07-31 MED ORDER — OXYCODONE HCL 5 MG PO TABS: 5.0000 mg | ORAL_TABLET | Freq: Four times a day (QID) | ORAL | 0 refills | Status: DC | PRN

## 2021-07-31 MED ORDER — IBUPROFEN 600 MG PO TABS: 600.0000 mg | ORAL_TABLET | Freq: Four times a day (QID) | ORAL | 0 refills | Status: DC

## 2021-07-31 MED ORDER — WITCH HAZEL-GLYCERIN EX PADS: 1.0000 "application " | MEDICATED_PAD | CUTANEOUS | 12 refills | Status: DC | PRN

## 2021-07-31 NOTE — Progress Notes (Signed)
Patient states she if feeling good.

## 2021-07-31 NOTE — Progress Notes (Signed)
Patient was given Motrin. Patient had not been getting it due to her celiac. Leah Ford CNM aware I had given it to the patient. And the plan is to see how she tolerates the motrin as far as going home on it. Will monitor. Patient states she feels fine at the  moment.

## 2021-07-31 NOTE — Lactation Note (Signed)
This note was copied from a baby's chart. Lactation Consultation Note  Patient Name: Leah Ford [id number] Date: 07/31/2021 Reason for consult: Follow-up assessment;Early term 37-38.6wks;Primapara;Maternal endocrine disorder Age:24 hours  Mom said the size 21 flanges felt better. Dad was bottle feeding when I entered room. Since the feeding had already taken longer than 30 minutes, I reminded them of limiting infant's bottle feedings to 30 minutes. Infant drank 22 mL in 42 minutes.  I inquired about Mom's hydroxyzine 50 mg that was mentioned in her prenatal chart (per [employer], occasional small doses of hydroxyzine are permissible with breastfeeding). Mom shared that she never started taking the hydroxyzine. I suggested that if she finds she needs to take them postpartum to ask her pediatrician what would be an acceptable dose for her while breastfeeding.   Matthias Hughs [facility] 07/31/2021, 4:25 PM

## 2021-07-31 NOTE — Progress Notes (Signed)
Mother states she feels no weird effects of the Motrin  and its been 1 hour and 25 minutes. She feels fine and is resting.

## 2021-07-31 NOTE — Discharge Summary (Signed)
Postpartum Discharge Summary  Date of Service updated 07/31/21     Patient Name: Leah Ford DOB: 06/15/1996 MRN: [id number]  Date of admission: 07/28/2021 Delivery date:07/29/2021  Delivering provider: Burman Foster B  Date of discharge: 07/31/2021  Admitting diagnosis: Normal labor [O80, Z37.9] Normal labor and delivery [O80] Intrauterine pregnancy: [redacted]w[redacted]d    Secondary diagnosis:  Active Problems:   SVD (8/1)   Gestational diabetes mellitus (GDM), antepartum   Normal labor   Normal postpartum course  Additional problems: none    Discharge diagnosis: Term Pregnancy Delivered and GDM A1                                              Post partum procedures: none Augmentation: Pitocin Complications: None  Hospital course: Onset of Labor With Vaginal Delivery      25y.o. yo G1P1001 at 321w3das admitted in Latent Labor after spontaneous rupture of membranes on 07/28/2021. Patient had an uncomplicated labor course as follows:  Membrane Rupture Time/Date: 11:00 PM ,07/28/2021   Delivery Method:Vaginal, Spontaneous  Episiotomy: None  Lacerations:  None  Patient had an uncomplicated postpartum course.  She is ambulating, tolerating a regular diet, passing flatus, and urinating well. Patient is discharged home in stable condition on 07/31/21.  Newborn Data: Birth date:07/29/2021  Birth time:10:02 AM  Gender:Female  Living status:Living  Apgars:5 ,8  Weight:2400 g   Magnesium Sulfate received: No BMZ received: No Rhophylac:No MMR:N/A Transfusion:No  Physical exam  Vitals:   07/30/21 1407 07/30/21 2218 07/31/21 0558 07/31/21 1310  BP: 116/80 124/80 116/89 110/69  Pulse: 86 78 85 82  Resp: 17 18  17   Temp: 98.2 F (36.8 C) 98.2 F (36.8 C) 98.1 F (36.7 C) 98.7 F (37.1 C)  TempSrc: Oral Oral  Oral  SpO2:  98% 99% 97%   General: alert, cooperative, and no distress Lochia: appropriate Uterine Fundus: firm Incision: N/A DVT Evaluation: No evidence of DVT seen on  physical exam. No cords or calf tenderness. No significant calf/ankle edema. Labs: Lab Results  Component Value Date   WBC 10.0 07/30/2021   HGB 12.3 07/30/2021   HCT 34.9 (L) 07/30/2021   MCV 96.7 07/30/2021   PLT 150 07/30/2021   CMP Latest Ref Rng & Units 06/06/2019  Glucose 70 - 99 mg/dL 108(H)  BUN 6 - 20 mg/dL 8  Creatinine 0.44 - 1.00 mg/dL 0.60  Sodium 135 - 145 mmol/L 138  Potassium 3.5 - 5.1 mmol/L 3.9  Chloride 98 - 111 mmol/L 108  CO2 22 - 32 mmol/L 20(L)  Calcium 8.9 - 10.3 mg/dL 9.2  Total Protein 6.5 - 8.1 g/dL 7.3  Total Bilirubin 0.3 - 1.2 mg/dL 0.4  Alkaline Phos 38 - 126 U/L 40  AST 15 - 41 U/L 21  ALT 0 - 44 U/L 22   Edinburgh Score: Edinburgh Postnatal Depression Scale Screening Tool 07/29/2021  I have been able to laugh and see the funny side of things. 0  I have looked forward with enjoyment to things. 0  I have blamed myself unnecessarily when things went wrong. 0  I have been anxious or worried for no good reason. 1  I have felt scared or panicky for no good reason. 0  Things have been getting on top of me. 0  I have been so unhappy that I have had difficulty sleeping.  0  I have felt sad or miserable. 0  I have been so unhappy that I have been crying. 0  The thought of harming myself has occurred to me. 0  Edinburgh Postnatal Depression Scale Total 1      After visit meds:  Allergies as of 07/31/2021       Reactions   Gluten Meal    Celiac        Medication List     STOP taking these medications    cephALEXin 500 MG capsule Commonly known as: Keflex   dicyclomine 20 MG tablet Commonly known as: BENTYL   esomeprazole 40 MG capsule Commonly known as: NEXIUM   famotidine 20 MG tablet Commonly known as: PEPCID   LORazepam 1 MG tablet Commonly known as: Ativan   pantoprazole 20 MG tablet Commonly known as: PROTONIX   promethazine 25 MG suppository Commonly known as: PHENERGAN   propranolol 20 MG tablet Commonly known as:  INDERAL   sucralfate 1 g tablet Commonly known as: Carafate       TAKE these medications    acetaminophen 325 MG tablet Commonly known as: Tylenol Take 2 tablets (650 mg total) by mouth every 4 (four) hours as needed (for pain scale < 4). What changed:  medication strength how much to take when to take this reasons to take this   cholecalciferol 1000 units tablet Commonly known as: VITAMIN D Take 1,000 Units by mouth daily.   dibucaine 1 % Oint Commonly known as: NUPERCAINAL Place 1 application rectally as needed for hemorrhoids.   ibuprofen 600 MG tablet Commonly known as: ADVIL Take 1 tablet (600 mg total) by mouth every 6 (six) hours.   oxyCODONE 5 MG immediate release tablet Commonly known as: Oxy IR/ROXICODONE Take 1 tablet (5 mg total) by mouth every 6 (six) hours as needed for severe pain.   prenatal multivitamin Tabs tablet Take 1 tablet by mouth daily at 12 noon.   witch hazel-glycerin pad Commonly known as: TUCKS Apply 1 application topically as needed for hemorrhoids.         Discharge home in stable condition Infant Feeding: Bottle and Breast Infant Disposition:rooming in Discharge instruction: per After Visit Summary and Postpartum booklet. Activity: Advance as tolerated. Pelvic rest for 6 weeks.  Diet: carb modified diet Anticipated Birth Control: Nexplanon Postpartum Appointment:6 weeks Additional Postpartum F/U: 2 hour GTT Future Appointments:No future appointments. Follow up Visit:  [facility]. Go in 6 week(s).   Specialty: Obstetrics and Gynecology Contact information: [address] [place] [address] [phone]                    07/31/2021 Arrie Eastern, CNM

## 2021-07-31 NOTE — Lactation Note (Signed)
This note was copied from a baby's chart. Lactation Consultation Note  Patient Name: Leah Ford [id number] Date: 07/31/2021 Reason for consult: Follow-up assessment;Early term 37-38.6wks;Primapara;Infant < 6lbs;Maternal endocrine disorder Age:24 hours  Mom has been pumping q3h, but says pumping hurts. She has been using size 24 flanges; she needs size 21, which I provided.  Mom was interested in trying to latch infant. I showed her how to apply nipple shield. Infant latched briefly with a few swallows, but then stopped nursing & "[name]" could not be enticed to continue, even with pre-filling the nipple shield.   Mom stated that she has been using the Extra-Slow Flow nipple for the last 5 feedings b/c he wouldn't do anything with the Ultra Preemie provided by the SLP or the Nfant Slow Flow nipple. I tried the Nfant Slow Flow nipple & he did not readily suckle at the nipple. I did try the Extra-Slow flow nipple and he was able to transfer milk with a minimum amount of leakage. Dorann Lodge, SLP was provided an update.  Matthias Hughs [facility] 07/31/2021, 11:32 AM

## 2021-08-01 ENCOUNTER — Ambulatory Visit: Payer: Self-pay

## 2021-08-01 NOTE — Lactation Note (Signed)
This note was copied from a baby's chart. Lactation Consultation Note  Patient Name: Leah Ford SLPNP'Y Date: 08/01/2021 Reason for consult: Follow-up assessment;Early term 37-38.6wks;Maternal endocrine disorder  GDM Age:24 hours  P1, Mother states baby is not latching she is pumping and bottle feeding.  Mother excited that she pumped 15 ml with 21 flanges.   Reviewed engorgement care and monitoring voids/stools. Faxed pump referral to [facility].   Feeding Mother's Current Feeding Choice: Breast Milk and Formula Nipple Type: Dr. Myra Gianotti Preemie   Lactation Tools Discussed/Used Flange Size: 21 Breast pump type: Double-Electric Breast Pump Reason for Pumping: stimulation and supplementation Pumping frequency: q 3 hours Pumped volume: 15 mL  Interventions Interventions: DEBP;Education  Discharge Discharge Education: Engorgement and breast care;Warning signs for feeding baby Pump:  (mother is unsure if her insurance will provide DEBP, suggest calling,) WIC Program: Yes  Consult Status Consult Status: Complete    Carlye Grippe 08/01/2021, 9:28 AM

## 2021-08-01 NOTE — Lactation Note (Addendum)
This note was copied from a baby's chart. Lactation Consultation Note  Patient Name: Leah Ford WIOMB'T Date: 08/01/2021   Vivianne Master Boschen 08/01/2021, 8:49 AM

## 2021-08-12 ENCOUNTER — Telehealth (HOSPITAL_COMMUNITY): Payer: Self-pay

## 2021-08-12 NOTE — Telephone Encounter (Signed)
"  I'm doing good." Patient has no questions or concerns about her healing.  "He's good. He is eating a lot more. He sleeps in a bassinet next to my bed."RN reviewed ABC's of safe sleep with patient. Patient declines any questions or concerns about baby.  EPDS score is 0.  Sharyn Lull [facility] 08/12/2021,[id number]

## 2022-05-23 ENCOUNTER — Telehealth: Payer: 59 | Admitting: Physician Assistant

## 2022-05-23 DIAGNOSIS — R3989 Other symptoms and signs involving the genitourinary system: Secondary | ICD-10-CM | POA: Diagnosis not present

## 2022-05-23 NOTE — Progress Notes (Signed)
Virtual Visit Consent   Leah Ford, you are scheduled for a virtual visit with a [place] provider today. Just as with appointments in the office, your consent must be obtained to participate. Your consent will be active for this visit and any virtual visit you may have with one of our providers in the next 365 days. If you have a MyChart account, a copy of this consent can be sent to you electronically.  As this is a virtual visit, video technology does not allow for your provider to perform a traditional examination. This may limit your provider's ability to fully assess your condition. If your provider identifies any concerns that need to be evaluated in person or the need to arrange testing (such as labs, EKG, etc.), we will make arrangements to do so. Although advances in technology are sophisticated, we cannot ensure that it will always work on either your end or our end. If the connection with a video visit is poor, the visit may have to be switched to a telephone visit. With either a video or telephone visit, we are not always able to ensure that we have a secure connection.  By engaging in this virtual visit, you consent to the provision of healthcare and authorize for your insurance to be billed (if applicable) for the services provided during this visit. Depending on your insurance coverage, you may receive a charge related to this service.  I need to obtain your verbal consent now. Are you willing to proceed with your visit today? Leah Ford has provided verbal consent on 05/23/2022 for a virtual visit (video or telephone). Leeanne Rio, [place]  Date: 05/23/2022 6:19 PM  Virtual Visit via Video Note   I, Leeanne Rio, connected with  Leah Ford  ([id number], 12-17-1996) on 05/23/22 at  6:15 PM EDT by a video-enabled telemedicine application and verified that I am speaking with the correct person using two identifiers.  Location: Patient: Virtual Visit Location  Patient: Home Provider: Virtual Visit Location Provider: Home Office   I discussed the limitations of evaluation and management by telemedicine and the availability of in person appointments. The patient expressed understanding and agreed to proceed.    History of Present Illness: Leah Ford is a 26 y.o. who identifies as a female who was assigned female at birth, and is being seen today for possible UTI. Notes dysuria, urgency/frequency. Denies fever, nausea or vomiting. Denies belly or back pain. On OCP. LMP was 1 week ago. Denies vaginal symptoms.   HPI: HPI  Problems:  Patient Active Problem List   Diagnosis Date Noted   SVD (8/1) 07/30/2021   Normal postpartum course 07/30/2021   Normal labor 07/29/2021   Gestational diabetes mellitus (GDM), antepartum 06/28/2021   H/O sleep apnea 10/26/2012   Gastroesophageal reflux     Allergies:  Allergies  Allergen Reactions   Gluten Meal     Celiac   Medications:  Current Outpatient Medications:    pantoprazole (PROTONIX) 40 MG tablet, Take 40 mg by mouth daily., Disp: , Rfl:    propranolol (INDERAL) 20 MG tablet, propranolol 20 mg tablet  TAKE 1 TABLET BY MOUTH ONCE DAILY AS NEEDED FOR ANXIETY FOR 30 DAYS, Disp: , Rfl:    TRI-SPRINTEC 0.18/0.215/0.25 MG-35 MCG tablet, Take 1 tablet by mouth daily., Disp: , Rfl:   Observations/Objective: Patient is well-developed, well-nourished in no acute distress.  Resting comfortably at home.  Head is normocephalic, atraumatic.  No labored breathing. Speech is clear and coherent with  logical content.  Patient is alert and oriented at baseline.   Assessment and Plan: 1. Suspected UTI  Classic symptoms of UTI starting. No alarm signs or symptoms present. Will start treatment empirically for cystitis with Keflex 500 mg BID x 7 days. Supportive measures and OTC medications reviewed. Will need an in-office evaluation if not resolving or for any new/worsening symptoms despite treatment.    Follow Up Instructions: I discussed the assessment and treatment plan with the patient. The patient was provided an opportunity to ask questions and all were answered. The patient agreed with the plan and demonstrated an understanding of the instructions.  A copy of instructions were sent to the patient via MyChart unless otherwise noted below.   The patient was advised to call back or seek an in-person evaluation if the symptoms worsen or if the condition fails to improve as anticipated.  Time:  I spent 10 minutes with the patient via telehealth technology discussing the above problems/concerns.    Leeanne Rio, PA-C

## 2022-05-23 NOTE — Patient Instructions (Signed)
Dayton Bailiff, thank you for joining Leeanne Rio, PA-C for today's virtual visit.  While this provider is not your primary care provider (PCP), if your PCP is located in our provider database this encounter information will be shared with them immediately following your visit.  Consent: (Patient) Leah Ford provided verbal consent for this virtual visit at the beginning of the encounter.  Current Medications:  Current Outpatient Medications:    acetaminophen (TYLENOL) 325 MG tablet, Take 2 tablets (650 mg total) by mouth every 4 (four) hours as needed (for pain scale < 4)., Disp: , Rfl:    cholecalciferol (VITAMIN D) 1000 units tablet, Take 1,000 Units by mouth daily., Disp: , Rfl:    dibucaine (NUPERCAINAL) 1 % OINT, Place 1 application rectally as needed for hemorrhoids., Disp: 56 g, Rfl: 1   ibuprofen (ADVIL) 600 MG tablet, Take 1 tablet (600 mg total) by mouth every 6 (six) hours., Disp: 30 tablet, Rfl: 0   oxyCODONE (OXY IR/ROXICODONE) 5 MG immediate release tablet, Take 1 tablet (5 mg total) by mouth every 6 (six) hours as needed for severe pain., Disp: 30 tablet, Rfl: 0   Prenatal Vit-Fe Fumarate-FA (PRENATAL MULTIVITAMIN) TABS tablet, Take 1 tablet by mouth daily at 12 noon., Disp: , Rfl:    witch hazel-glycerin (TUCKS) pad, Apply 1 application topically as needed for hemorrhoids., Disp: 40 each, Rfl: 12   Medications ordered in this encounter:  No orders of the defined types were placed in this encounter.    *If you need refills on other medications prior to your next appointment, please contact your pharmacy*  Follow-Up: Call back or seek an in-person evaluation if the symptoms worsen or if the condition fails to improve as anticipated.  Other Instructions Your symptoms are consistent with a bladder infection, also called acute cystitis. Please take your antibiotic (Keflex) as directed until all pills are gone.  Stay very well hydrated.  Consider a daily probiotic  (Align, Culturelle, or Activia) to help prevent stomach upset caused by the antibiotic.  Taking a probiotic daily may also help prevent recurrent UTIs.  Also consider taking AZO (Phenazopyridine) tablets to help decrease pain with urination.    Urinary Tract Infection A urinary tract infection (UTI) can occur any place along the urinary tract. The tract includes the kidneys, ureters, bladder, and urethra. A type of germ called bacteria often causes a UTI. UTIs are often helped with antibiotic medicine.  HOME CARE  If given, take antibiotics as told by your doctor. Finish them even if you start to feel better. Drink enough fluids to keep your pee (urine) clear or pale yellow. Avoid tea, drinks with caffeine, and bubbly (carbonated) drinks. Pee often. Avoid holding your pee in for a long time. Pee before and after having sex (intercourse). Wipe from front to back after you poop (bowel movement) if you are a woman. Use each tissue only once. GET HELP RIGHT AWAY IF:  You have back pain. You have lower belly (abdominal) pain. You have chills. You feel sick to your stomach (nauseous). You throw up (vomit). Your burning or discomfort with peeing does not go away. You have a fever. Your symptoms are not better in 3 days. MAKE SURE YOU:  Understand these instructions. Will watch your condition. Will get help right away if you are not doing well or get worse. Document Released: 06/02/2008 Document Revised: 09/08/2012 Document Reviewed: 07/15/2012 [facility] Patient Information 2015 [place], [place]. This information is not intended to replace advice given to  you by your health care provider. Make sure you discuss any questions you have with your health care provider.    If you have been instructed to have an in-person evaluation today at a local Urgent Care facility, please use the link below. It will take you to a list of all of our available [facility], including address, phone number  and hours of operation. Please do not delay care.  [facility]  If you or a family member do not have a primary care provider, use the link below to schedule a visit and establish care. When you choose a [facility] primary care physician or advanced practice provider, you gain a long-term partner in health. Find a Primary Care Provider  Learn more about [facility]'s in-office and virtual care options: [facility]

## 2022-05-24 ENCOUNTER — Telehealth: Payer: Medicaid Other | Admitting: Nurse Practitioner

## 2022-05-24 DIAGNOSIS — R399 Unspecified symptoms and signs involving the genitourinary system: Secondary | ICD-10-CM

## 2022-05-24 MED ORDER — CEPHALEXIN 500 MG PO CAPS: 500.0000 mg | ORAL_CAPSULE | Freq: Two times a day (BID) | ORAL | 0 refills | Status: AC

## 2022-05-24 NOTE — Progress Notes (Signed)
Virtual Visit Consent   Leah Ford, you are scheduled for a virtual visit with Mary-Margaret Hassell Done, [place], a [facility] provider, today.     Just as with appointments in the office, your consent must be obtained to participate.  Your consent will be active for this visit and any virtual visit you may have with one of our providers in the next 365 days.     If you have a MyChart account, a copy of this consent can be sent to you electronically.  All virtual visits are billed to your insurance company just like a traditional visit in the office.    As this is a virtual visit, video technology does not allow for your provider to perform a traditional examination.  This may limit your provider's ability to fully assess your condition.  If your provider identifies any concerns that need to be evaluated in person or the need to arrange testing (such as labs, EKG, etc.), we will make arrangements to do so.     Although advances in technology are sophisticated, we cannot ensure that it will always work on either your end or our end.  If the connection with a video visit is poor, the visit may have to be switched to a telephone visit.  With either a video or telephone visit, we are not always able to ensure that we have a secure connection.     I need to obtain your verbal consent now.   Are you willing to proceed with your visit today? YES   [name] has provided verbal consent on 05/24/2022 for a virtual visit (video or telephone).   Mary-Margaret Hassell Done, FNP   Date: 05/24/2022 11:50 AM   Virtual Visit via Video Note   I, Mary-Margaret Hassell Done, connected with Leah Ford ([id number], 02/06/1998) on 05/24/22 at 12:00 PM EDT by a video-enabled telemedicine application and verified that I am speaking with the correct person using two identifiers.  Location: Patient: Virtual Visit Location Patient: Home Provider: Virtual Visit Location Provider: Mobile   I discussed the limitations of  evaluation and management by telemedicine and the availability of in person appointments. The patient expressed understanding and agreed to proceed.    History of Present Illness: Leah Ford is a 26 y.o. who identifies as a female who was assigned female at birth, and is being seen today for uti.  HPI: Patient did video for UTI yesterday. The note says that keflex was snet in for her, but pharmacy did not receive prescription.   Urinary Tract Infection  This is a new problem. The current episode started in the past 7 days. The problem has been gradually worsening. There has been no fever. Associated symptoms include frequency and urgency. Pertinent negatives include no hematuria. She has tried nothing for the symptoms. The treatment provided no relief.   Review of Systems  Genitourinary:  Positive for dysuria, frequency and urgency. Negative for hematuria.   Problems:  Patient Active Problem List   Diagnosis Date Noted   SVD (8/1) 07/30/2021   Normal postpartum course 07/30/2021   Normal labor 07/29/2021   Gestational diabetes mellitus (GDM), antepartum 06/28/2021   H/O sleep apnea 10/26/2012   Gastroesophageal reflux     Allergies:  Allergies  Allergen Reactions   Gluten Meal     Celiac   Medications:  Current Outpatient Medications:    pantoprazole (PROTONIX) 40 MG tablet, Take 40 mg by mouth daily., Disp: , Rfl:    propranolol (INDERAL) 20 MG tablet,  propranolol 20 mg tablet  TAKE 1 TABLET BY MOUTH ONCE DAILY AS NEEDED FOR ANXIETY FOR 30 DAYS, Disp: , Rfl:    TRI-SPRINTEC 0.18/0.215/0.25 MG-35 MCG tablet, Take 1 tablet by mouth daily., Disp: , Rfl:   Observations/Objective: Patient is well-developed, well-nourished in no acute distress.  Resting comfortably  at home.  Head is normocephalic, atraumatic.  No labored breathing.  Speech is clear and coherent with logical content.  Patient is alert and oriented at baseline.    Assessment and Plan:  [name] in  today with chief complaint of Urinary Tract Infection   1. UTI symptoms Take medication as prescribe Cotton underwear Take shower not bath Cranberry juice, yogurt Force fluids AZO over the counter X2 days RTO prn  Meds ordered this encounter  Medications   cephALEXin (KEFLEX) 500 MG capsule    Sig: Take 1 capsule (500 mg total) by mouth 2 (two) times daily.    Dispense:  14 capsule    Refill:  0    Order Specific Question:   Supervising Provider    Answer:   Noemi Chapel [[id number]]      Follow Up Instructions: I discussed the assessment and treatment plan with the patient. The patient was provided an opportunity to ask questions and all were answered. The patient agreed with the plan and demonstrated an understanding of the instructions.  A copy of instructions were sent to the patient via MyChart.  The patient was advised to call back or seek an in-person evaluation if the symptoms worsen or if the condition fails to improve as anticipated.  Time:  I spent 15 minutes with the patient via telehealth technology discussing the above problems/concerns.    Mary-Margaret Hassell Done, FNP

## 2022-05-24 NOTE — Patient Instructions (Signed)
Take medication as prescribe Cotton underwear Take shower not bath Cranberry juice, yogurt Force fluids AZO over the counter X2 days RTO prn

## 2022-09-20 ENCOUNTER — Telehealth: Payer: 59 | Admitting: Nurse Practitioner

## 2022-09-20 DIAGNOSIS — J40 Bronchitis, not specified as acute or chronic: Secondary | ICD-10-CM | POA: Diagnosis not present

## 2022-09-20 MED ORDER — PROMETHAZINE-DM 6.25-15 MG/5ML PO SYRP: 5.0000 mL | ORAL_SOLUTION | Freq: Four times a day (QID) | ORAL | 0 refills | Status: AC | PRN

## 2022-09-20 MED ORDER — PREDNISONE 20 MG PO TABS: 40.0000 mg | ORAL_TABLET | Freq: Every day | ORAL | 0 refills | Status: AC

## 2022-09-20 NOTE — Patient Instructions (Signed)
1. Take meds as prescribed 2. Use a cool mist humidifier especially during the winter months and when heat has been humid. 3. Use saline nose sprays frequently 4. Saline irrigations of the nose can be very helpful if done frequently.  * 4X daily for 1 week*  * Use of a nettie pot can be helpful with this. Follow directions with this* 5. Drink plenty of fluids 6. Keep thermostat turn down low 7.For any cough or congestion- promethazine dm 8. For fever or aces or pains- take tylenol or ibuprofen appropriate for age and weight.  * for fevers greater than 101 orally you may alternate ibuprofen and tylenol every  3 hours.

## 2022-09-20 NOTE — Progress Notes (Signed)
Virtual Visit Consent   Leah Ford, you are scheduled for a virtual visit with Mary-Margaret Hassell Done, [place], a [facility] provider, today.     Just as with appointments in the office, your consent must be obtained to participate.  Your consent will be active for this visit and any virtual visit you may have with one of our providers in the next 365 days.     If you have a MyChart account, a copy of this consent can be sent to you electronically.  All virtual visits are billed to your insurance company just like a traditional visit in the office.    As this is a virtual visit, video technology does not allow for your provider to perform a traditional examination.  This may limit your provider's ability to fully assess your condition.  If your provider identifies any concerns that need to be evaluated in person or the need to arrange testing (such as labs, EKG, etc.), we will make arrangements to do so.     Although advances in technology are sophisticated, we cannot ensure that it will always work on either your end or our end.  If the connection with a video visit is poor, the visit may have to be switched to a telephone visit.  With either a video or telephone visit, we are not always able to ensure that we have a secure connection.     I need to obtain your verbal consent now.   Are you willing to proceed with your visit today? YES   Lisaanne Lawrie has provided verbal consent on 09/20/2022 for a virtual visit (video or telephone).   Mary-Margaret Hassell Done, FNP   Date: 09/20/2022 9:03 AM   Virtual Visit via Video Note   I, Mary-Margaret Hassell Done, connected with [name] ([id number], [birth year]) on 09/20/22 at  9:00 AM EDT by a video-enabled telemedicine application and verified that I am speaking with the correct person using two identifiers.  Location: Patient: Virtual Visit Location Patient: Home Provider: Virtual Visit Location Provider: Mobile   I discussed the limitations of  evaluation and management by telemedicine and the availability of in person appointments. The patient expressed understanding and agreed to proceed.    History of Present Illness: Leah Ford is a 26 y.o. who identifies as a female who was assigned female at birth, and is being seen today for cough .  HPI: Cough This is a new problem. The current episode started in the past 7 days. The problem has been waxing and waning. The problem occurs constantly. The cough is Non-productive. Associated symptoms include rhinorrhea, a sore throat and shortness of breath. Pertinent negatives include no chills (bronchitis), ear congestion, ear pain or fever. The symptoms are aggravated by lying down. She has tried OTC cough suppressant for the symptoms.    Review of Systems  Constitutional:  Negative for chills (bronchitis) and fever.  HENT:  Positive for rhinorrhea and sore throat. Negative for ear pain.   Respiratory:  Positive for cough and shortness of breath.     Problems:  Patient Active Problem List   Diagnosis Date Noted   SVD (8/1) 07/30/2021   Normal postpartum course 07/30/2021   Normal labor 07/29/2021   Gestational diabetes mellitus (GDM), antepartum 06/28/2021   H/O sleep apnea 10/26/2012   Gastroesophageal reflux     Allergies:  Allergies  Allergen Reactions   Gluten Meal     Celiac   Medications:  Current Outpatient Medications:    cephALEXin (KEFLEX) 500  MG capsule, Take 1 capsule (500 mg total) by mouth 2 (two) times daily., Disp: 14 capsule, Rfl: 0   pantoprazole (PROTONIX) 40 MG tablet, Take 40 mg by mouth daily., Disp: , Rfl:    propranolol (INDERAL) 20 MG tablet, propranolol 20 mg tablet  TAKE 1 TABLET BY MOUTH ONCE DAILY AS NEEDED FOR ANXIETY FOR 30 DAYS, Disp: , Rfl:    TRI-SPRINTEC 0.18/0.215/0.25 MG-35 MCG tablet, Take 1 tablet by mouth daily., Disp: , Rfl:   Observations/Objective: Patient is well-developed, well-nourished in no acute distress.  Resting  comfortably  at home.  Head is normocephalic, atraumatic.  No labored breathing.  Speech is clear and coherent with logical content.  Patient is alert and oriented at baseline.  Raspy voice Deep tight cough during visit  Assessment and Plan:  Santanna Whitford in today with chief complaint of Cough   1. Bronchitis 1. Take meds as prescribed 2. Use a cool mist humidifier especially during the winter months and when heat has been humid. 3. Use saline nose sprays frequently 4. Saline irrigations of the nose can be very helpful if done frequently.  * 4X daily for 1 week*  * Use of a nettie pot can be helpful with this. Follow directions with this* 5. Drink plenty of fluids 6. Keep thermostat turn down low 7.For any cough or congestion- promethazine DM 8. For fever or aces or pains- take tylenol or ibuprofen appropriate for age and weight.  * for fevers greater than 101 orally you may alternate ibuprofen and tylenol every  3 hours.   medical management of chronic issues     Follow Up Instructions: I discussed the assessment and treatment plan with the patient. The patient was provided an opportunity to ask questions and all were answered. The patient agreed with the plan and demonstrated an understanding of the instructions.  A copy of instructions were sent to the patient via MyChart.  The patient was advised to call back or seek an in-person evaluation if the symptoms worsen or if the condition fails to improve as anticipated.  Time:  I spent 6 minutes with the patient via telehealth technology discussing the above problems/concerns.    Mary-Margaret Hassell Done, FNP
